# Patient Record
Sex: Male | Born: 1970
Health system: Southern US, Community
[De-identification: ages and names within clinical notes are randomized; demographics above are authoritative.]

## PROBLEM LIST (undated history)

## (undated) DIAGNOSIS — S129XXA Fracture of neck, unspecified, initial encounter: Secondary | ICD-10-CM

## (undated) DIAGNOSIS — S2239XA Fracture of one rib, unspecified side, initial encounter for closed fracture: Secondary | ICD-10-CM

## (undated) DIAGNOSIS — F419 Anxiety disorder, unspecified: Secondary | ICD-10-CM

## (undated) DIAGNOSIS — I219 Acute myocardial infarction, unspecified: Secondary | ICD-10-CM

## (undated) DIAGNOSIS — S2249XA Multiple fractures of ribs, unspecified side, initial encounter for closed fracture: Secondary | ICD-10-CM

## (undated) DIAGNOSIS — G43909 Migraine, unspecified, not intractable, without status migrainosus: Secondary | ICD-10-CM

## (undated) DIAGNOSIS — K219 Gastro-esophageal reflux disease without esophagitis: Secondary | ICD-10-CM

## (undated) DIAGNOSIS — N289 Disorder of kidney and ureter, unspecified: Secondary | ICD-10-CM

## (undated) DIAGNOSIS — J9819 Other pulmonary collapse: Secondary | ICD-10-CM

## (undated) DIAGNOSIS — M549 Dorsalgia, unspecified: Secondary | ICD-10-CM

## (undated) DIAGNOSIS — G8929 Other chronic pain: Secondary | ICD-10-CM

## (undated) HISTORY — PX: OTHER SURGICAL HISTORY: SHX169

## (undated) HISTORY — PX: KIDNEY STONE SURGERY: SHX686

---

## 2005-02-22 ENCOUNTER — Ambulatory Visit: Payer: Self-pay | Admitting: Cardiology

## 2006-02-05 ENCOUNTER — Ambulatory Visit: Payer: Self-pay | Admitting: Family Medicine

## 2006-02-16 ENCOUNTER — Ambulatory Visit: Payer: Self-pay | Admitting: Family Medicine

## 2006-02-23 ENCOUNTER — Ambulatory Visit: Payer: Self-pay | Admitting: Family Medicine

## 2006-04-09 ENCOUNTER — Ambulatory Visit (HOSPITAL_COMMUNITY): Admission: RE | Admit: 2006-04-09 | Discharge: 2006-04-09 | Payer: Self-pay | Admitting: Family Medicine

## 2006-04-16 ENCOUNTER — Ambulatory Visit (HOSPITAL_COMMUNITY): Admission: RE | Admit: 2006-04-16 | Discharge: 2006-04-16 | Payer: Self-pay | Admitting: Family Medicine

## 2006-05-07 ENCOUNTER — Ambulatory Visit (HOSPITAL_COMMUNITY): Admission: RE | Admit: 2006-05-07 | Discharge: 2006-05-07 | Payer: Self-pay | Admitting: Family Medicine

## 2006-05-21 ENCOUNTER — Ambulatory Visit (HOSPITAL_COMMUNITY): Admission: RE | Admit: 2006-05-21 | Discharge: 2006-05-21 | Payer: Self-pay | Admitting: Family Medicine

## 2007-03-12 ENCOUNTER — Encounter (INDEPENDENT_AMBULATORY_CARE_PROVIDER_SITE_OTHER): Payer: Self-pay | Admitting: Family Medicine

## 2007-05-07 ENCOUNTER — Encounter (INDEPENDENT_AMBULATORY_CARE_PROVIDER_SITE_OTHER): Payer: Self-pay | Admitting: Family Medicine

## 2007-07-29 DIAGNOSIS — F411 Generalized anxiety disorder: Secondary | ICD-10-CM | POA: Insufficient documentation

## 2007-07-29 DIAGNOSIS — F419 Anxiety disorder, unspecified: Secondary | ICD-10-CM | POA: Insufficient documentation

## 2007-07-29 DIAGNOSIS — Z87898 Personal history of other specified conditions: Secondary | ICD-10-CM | POA: Insufficient documentation

## 2007-07-29 DIAGNOSIS — J438 Other emphysema: Secondary | ICD-10-CM | POA: Insufficient documentation

## 2007-07-29 DIAGNOSIS — M545 Low back pain, unspecified: Secondary | ICD-10-CM | POA: Insufficient documentation

## 2007-07-29 DIAGNOSIS — F329 Major depressive disorder, single episode, unspecified: Secondary | ICD-10-CM | POA: Insufficient documentation

## 2007-07-29 DIAGNOSIS — Z8679 Personal history of other diseases of the circulatory system: Secondary | ICD-10-CM | POA: Insufficient documentation

## 2007-07-29 DIAGNOSIS — K219 Gastro-esophageal reflux disease without esophagitis: Secondary | ICD-10-CM | POA: Insufficient documentation

## 2009-06-18 ENCOUNTER — Emergency Department (HOSPITAL_COMMUNITY): Admission: EM | Admit: 2009-06-18 | Discharge: 2009-06-18 | Payer: Self-pay | Admitting: Family Medicine

## 2011-12-09 ENCOUNTER — Emergency Department (HOSPITAL_COMMUNITY)
Admission: EM | Admit: 2011-12-09 | Discharge: 2011-12-09 | Disposition: A | Discharge status: Home / Self Care | Payer: Self-pay | Attending: Emergency Medicine | Admitting: Emergency Medicine

## 2011-12-09 ENCOUNTER — Emergency Department (HOSPITAL_COMMUNITY): Payer: Self-pay

## 2011-12-09 ENCOUNTER — Encounter (HOSPITAL_COMMUNITY): Payer: Self-pay | Admitting: *Deleted

## 2011-12-09 DIAGNOSIS — J40 Bronchitis, not specified as acute or chronic: Secondary | ICD-10-CM

## 2011-12-09 DIAGNOSIS — F17200 Nicotine dependence, unspecified, uncomplicated: Secondary | ICD-10-CM | POA: Insufficient documentation

## 2011-12-09 HISTORY — DX: Other pulmonary collapse: J98.19

## 2011-12-09 HISTORY — DX: Fracture of one rib, unspecified side, initial encounter for closed fracture: S22.39XA

## 2011-12-09 HISTORY — DX: Multiple fractures of ribs, unspecified side, initial encounter for closed fracture: S22.49XA

## 2011-12-09 MED ORDER — ALBUTEROL SULFATE HFA 108 (90 BASE) MCG/ACT IN AERS
2.0000 | INHALATION_SPRAY | Freq: Four times a day (QID) | RESPIRATORY_TRACT | Status: DC
  Administered 2011-12-09: 2 via RESPIRATORY_TRACT
  Filled 2011-12-09: qty 6.7

## 2011-12-09 NOTE — ED Notes (Signed)
Pt c/o cold, cough, congestion, chest tightness, fever x 2 days. Pt states that his phlegm is Kharlie Bring.

## 2011-12-09 NOTE — Discharge Instructions (Signed)
Bronchitis Bronchitis is a problem of the air tubes leading to your lungs. This problem makes it hard for air to get in and out of the lungs. You may cough a lot because your air tubes are narrow. Going without care can cause lasting (chronic) bronchitis. HOME CARE   Drink enough fluids to keep your pee (urine) clear or pale yellow.   Use a cool mist humidifier.   Quit smoking if you smoke. If you keep smoking, the bronchitis might not get better.   Only take medicine as told by your doctor.  GET HELP RIGHT AWAY IF:   Coughing keeps you awake.   You start to wheeze.   You become more sick or weak.   You have a hard time breathing or get short of breath.   You cough up blood.   Coughing lasts more than 2 weeks.   You have a fever.   Your baby is older than 3 months with a rectal temperature of 102 F (38.9 C) or higher.   Your baby is 10 months old or younger with a rectal temperature of 100.4 F (38 C) or higher.  MAKE SURE YOU:  Understand these instructions.   Will watch your condition.   Will get help right away if you are not doing well or get worse.  Document Released: 01/31/2008 Document Revised: 08/03/2011 Document Reviewed: 07/16/2009 [facility] Patient Information 2012 [place], [place].  Use albuterol inhaler 2 puffs every 6 hours for the next week. Also recommend Robitussin-DM over-the-counter cough medicine for the cough. Return for any new or worse symptoms. Rest increase fluids. Chest x-ray today without evidence of pneumonia.
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
## 2013-08-19 ENCOUNTER — Emergency Department (HOSPITAL_COMMUNITY): Payer: Self-pay

## 2013-08-19 ENCOUNTER — Emergency Department (HOSPITAL_COMMUNITY)
Admission: EM | Admit: 2013-08-19 | Discharge: 2013-08-19 | Disposition: A | Discharge status: Home / Self Care | Payer: Self-pay | Attending: Emergency Medicine | Admitting: Emergency Medicine

## 2013-08-19 ENCOUNTER — Encounter (HOSPITAL_COMMUNITY): Payer: Self-pay | Admitting: Emergency Medicine

## 2013-08-19 DIAGNOSIS — Z8781 Personal history of (healed) traumatic fracture: Secondary | ICD-10-CM | POA: Insufficient documentation

## 2013-08-19 DIAGNOSIS — M549 Dorsalgia, unspecified: Secondary | ICD-10-CM | POA: Insufficient documentation

## 2013-08-19 DIAGNOSIS — F17200 Nicotine dependence, unspecified, uncomplicated: Secondary | ICD-10-CM | POA: Insufficient documentation

## 2013-08-19 DIAGNOSIS — G8929 Other chronic pain: Secondary | ICD-10-CM | POA: Insufficient documentation

## 2013-08-19 DIAGNOSIS — Z8709 Personal history of other diseases of the respiratory system: Secondary | ICD-10-CM | POA: Insufficient documentation

## 2013-08-19 DIAGNOSIS — R110 Nausea: Secondary | ICD-10-CM | POA: Insufficient documentation

## 2013-08-19 DIAGNOSIS — N200 Calculus of kidney: Secondary | ICD-10-CM | POA: Insufficient documentation

## 2013-08-19 HISTORY — DX: Fracture of neck, unspecified, initial encounter: S12.9XXA

## 2013-08-19 HISTORY — DX: Dorsalgia, unspecified: M54.9

## 2013-08-19 HISTORY — DX: Other chronic pain: G89.29

## 2013-08-19 LAB — CBC WITH DIFFERENTIAL/PLATELET
Basophils Absolute: 0 10*3/uL (ref 0.0–0.1)
Basophils Relative: 0 % (ref 0–1)
Eosinophils Absolute: 0.1 10*3/uL (ref 0.0–0.7)
Eosinophils Relative: 1 % (ref 0–5)
MCH: 29.8 pg (ref 26.0–34.0)
MCV: 91.4 fL (ref 78.0–100.0)
Neutrophils Relative %: 67 % (ref 43–77)
Platelets: 304 10*3/uL (ref 150–400)
RBC: 5.26 MIL/uL (ref 4.22–5.81)
RDW: 13 % (ref 11.5–15.5)

## 2013-08-19 LAB — COMPREHENSIVE METABOLIC PANEL WITH GFR
ALT: 21 U/L (ref 0–53)
AST: 20 U/L (ref 0–37)
Albumin: 3.7 g/dL (ref 3.5–5.2)
Alkaline Phosphatase: 62 U/L (ref 39–117)
BUN: 18 mg/dL (ref 6–23)
CO2: 27 meq/L (ref 19–32)
Calcium: 9.3 mg/dL (ref 8.4–10.5)
Chloride: 98 meq/L (ref 96–112)
Creatinine, Ser: 1.59 mg/dL — ABNORMAL HIGH (ref 0.50–1.35)
GFR calc Af Amer: 60 mL/min — ABNORMAL LOW
GFR calc non Af Amer: 52 mL/min — ABNORMAL LOW
Glucose, Bld: 104 mg/dL — ABNORMAL HIGH (ref 70–99)
Potassium: 4.2 meq/L (ref 3.5–5.1)
Sodium: 136 meq/L (ref 135–145)
Total Bilirubin: 0.8 mg/dL (ref 0.3–1.2)
Total Protein: 7.5 g/dL (ref 6.0–8.3)

## 2013-08-19 MED ORDER — OXYCODONE-ACETAMINOPHEN 5-325 MG PO TABS: 1.0000 | ORAL_TABLET | Freq: Four times a day (QID) | ORAL | Status: DC | PRN

## 2013-08-19 MED ORDER — PROMETHAZINE HCL 25 MG PO TABS: 25.0000 mg | ORAL_TABLET | Freq: Four times a day (QID) | ORAL | Status: DC | PRN

## 2013-08-19 MED ORDER — HYDROMORPHONE HCL PF 1 MG/ML IJ SOLN
1.0000 mg | Freq: Once | INTRAMUSCULAR | Status: AC
  Administered 2013-08-19: 1 mg via INTRAVENOUS
  Filled 2013-08-19: qty 1

## 2013-08-19 MED ORDER — KETOROLAC TROMETHAMINE 30 MG/ML IJ SOLN
30.0000 mg | Freq: Once | INTRAMUSCULAR | Status: AC
  Administered 2013-08-19: 30 mg via INTRAVENOUS
  Filled 2013-08-19: qty 1

## 2013-08-19 MED ORDER — ONDANSETRON HCL 4 MG/2ML IJ SOLN
4.0000 mg | Freq: Once | INTRAMUSCULAR | Status: AC
  Administered 2013-08-19: 4 mg via INTRAVENOUS
  Filled 2013-08-19: qty 2

## 2013-08-19 MED ORDER — TAMSULOSIN HCL 0.4 MG PO CAPS: 0.4000 mg | ORAL_CAPSULE | Freq: Every day | ORAL | Status: DC

## 2013-08-19 MED ORDER — SODIUM CHLORIDE 0.9 % IV BOLUS (SEPSIS)
1000.0000 mL | Freq: Once | INTRAVENOUS | Status: AC
  Administered 2013-08-19: 1000 mL via INTRAVENOUS

## 2013-08-19 MED ORDER — HYDROMORPHONE HCL PF 1 MG/ML IJ SOLN
0.5000 mg | Freq: Once | INTRAMUSCULAR | Status: AC
  Administered 2013-08-19: 0.5 mg via INTRAVENOUS
  Filled 2013-08-19: qty 1

## 2013-08-19 NOTE — ED Notes (Signed)
Pt reports having right side lower back pain for 2 days, the pain radiates to his left lower back and around to his groin area. Denies any fever, +nausea, no vomiting or diarrhea, last bm yesterday.  H/o of kidney stones and chronic back pain.
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
## 2013-08-19 NOTE — ED Notes (Signed)
States his pain is starting to come back .  Rates pain a 4/10

## 2013-08-25 ENCOUNTER — Emergency Department (HOSPITAL_COMMUNITY): Payer: Self-pay

## 2013-08-25 ENCOUNTER — Emergency Department (HOSPITAL_COMMUNITY)
Admission: EM | Admit: 2013-08-25 | Discharge: 2013-08-25 | Disposition: A | Discharge status: Home / Self Care | Payer: Self-pay | Attending: Emergency Medicine | Admitting: Emergency Medicine

## 2013-08-25 ENCOUNTER — Encounter (HOSPITAL_COMMUNITY): Payer: Self-pay | Admitting: Emergency Medicine

## 2013-08-25 DIAGNOSIS — M79609 Pain in unspecified limb: Secondary | ICD-10-CM | POA: Insufficient documentation

## 2013-08-25 DIAGNOSIS — N23 Unspecified renal colic: Secondary | ICD-10-CM

## 2013-08-25 DIAGNOSIS — Z79899 Other long term (current) drug therapy: Secondary | ICD-10-CM | POA: Insufficient documentation

## 2013-08-25 DIAGNOSIS — N200 Calculus of kidney: Secondary | ICD-10-CM | POA: Insufficient documentation

## 2013-08-25 DIAGNOSIS — G8929 Other chronic pain: Secondary | ICD-10-CM | POA: Insufficient documentation

## 2013-08-25 DIAGNOSIS — Z87828 Personal history of other (healed) physical injury and trauma: Secondary | ICD-10-CM | POA: Insufficient documentation

## 2013-08-25 DIAGNOSIS — R110 Nausea: Secondary | ICD-10-CM | POA: Insufficient documentation

## 2013-08-25 DIAGNOSIS — Z8709 Personal history of other diseases of the respiratory system: Secondary | ICD-10-CM | POA: Insufficient documentation

## 2013-08-25 DIAGNOSIS — Z791 Long term (current) use of non-steroidal anti-inflammatories (NSAID): Secondary | ICD-10-CM | POA: Insufficient documentation

## 2013-08-25 DIAGNOSIS — R1110 Vomiting, unspecified: Secondary | ICD-10-CM | POA: Insufficient documentation

## 2013-08-25 DIAGNOSIS — Z8781 Personal history of (healed) traumatic fracture: Secondary | ICD-10-CM | POA: Insufficient documentation

## 2013-08-25 DIAGNOSIS — F17200 Nicotine dependence, unspecified, uncomplicated: Secondary | ICD-10-CM | POA: Insufficient documentation

## 2013-08-25 LAB — URINALYSIS, ROUTINE W REFLEX MICROSCOPIC
Bilirubin Urine: NEGATIVE
Glucose, UA: NEGATIVE mg/dL
Nitrite: NEGATIVE
Specific Gravity, Urine: >1.03 — ABNORMAL HIGH (ref 1.005–1.030)
pH: 5.5 (ref 5.0–8.0)

## 2013-08-25 LAB — POCT I-STAT, CHEM 8
BUN: 15 mg/dL (ref 6–23)
Chloride: 106 mEq/L (ref 96–112)
Glucose, Bld: 86 mg/dL (ref 70–99)
HCT: 43 % (ref 39.0–52.0)
Potassium: 4 mEq/L (ref 3.5–5.1)

## 2013-08-25 LAB — URINE MICROSCOPIC-ADD ON

## 2013-08-25 MED ORDER — HYDROMORPHONE HCL PF 1 MG/ML IJ SOLN
1.0000 mg | Freq: Once | INTRAMUSCULAR | Status: AC
  Administered 2013-08-25: 1 mg via INTRAVENOUS
  Filled 2013-08-25: qty 1

## 2013-08-25 MED ORDER — CIPROFLOXACIN HCL 500 MG PO TABS: 500.0000 mg | ORAL_TABLET | Freq: Two times a day (BID) | ORAL | Status: DC

## 2013-08-25 MED ORDER — SODIUM CHLORIDE 0.9 % IV BOLUS (SEPSIS)
1000.0000 mL | Freq: Once | INTRAVENOUS | Status: AC
  Administered 2013-08-25: 1000 mL via INTRAVENOUS

## 2013-08-25 MED ORDER — ONDANSETRON HCL 4 MG/2ML IJ SOLN
4.0000 mg | Freq: Once | INTRAMUSCULAR | Status: AC
  Administered 2013-08-25: 4 mg via INTRAVENOUS
  Filled 2013-08-25: qty 2

## 2013-08-25 MED ORDER — OXYCODONE-ACETAMINOPHEN 10-325 MG PO TABS: 1.0000 | ORAL_TABLET | ORAL | Status: DC | PRN

## 2013-08-25 MED ORDER — CIPROFLOXACIN HCL 250 MG PO TABS
500.0000 mg | ORAL_TABLET | Freq: Once | ORAL | Status: AC
  Administered 2013-08-25: 500 mg via ORAL
  Filled 2013-08-25: qty 2
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
## 2013-08-25 NOTE — Discharge Instructions (Signed)
Kidney Stones  Kidney stones (urolithiasis) are deposits that form inside your kidneys. The intense pain is caused by the stone moving through the urinary tract. When the stone moves, the ureter goes into spasm around the stone. The stone is usually passed in the urine.   CAUSES   · A disorder that makes certain neck glands produce too much parathyroid hormone (primary hyperparathyroidism).  · A buildup of uric acid crystals, similar to gout in your joints.  · Narrowing (stricture) of the ureter.  · A kidney obstruction present at birth (congenital obstruction).  · Previous surgery on the kidney or ureters.  · Numerous kidney infections.  SYMPTOMS   · Feeling sick to your stomach (nauseous).  · Throwing up (vomiting).  · Blood in the urine (hematuria).  · Pain that usually spreads (radiates) to the groin.  · Frequency or urgency of urination.  DIAGNOSIS   · Taking a history and physical exam.  · Blood or urine tests.  · CT scan.  · Occasionally, an examination of the inside of the urinary bladder (cystoscopy) is performed.  TREATMENT   · Observation.  · Increasing your fluid intake.  · Extracorporeal shock wave lithotripsy This is a noninvasive procedure that uses shock waves to break up kidney stones.  · Surgery may be needed if you have severe pain or persistent obstruction. There are various surgical procedures. Most of the procedures are performed with the use of small instruments. Only small incisions are needed to accommodate these instruments, so recovery time is minimized.  The size, location, and chemical composition are all important variables that will determine the proper choice of action for you. Talk to your health care provider to better understand your situation so that you will minimize the risk of injury to yourself and your kidney.   HOME CARE INSTRUCTIONS   · Drink enough water and fluids to keep your urine clear or pale yellow. This will help you to pass the stone or stone fragments.  · Strain  all urine through the provided strainer. Keep all particulate matter and stones for your health care provider to see. The stone causing the pain may be as small as a grain of salt. It is very important to use the strainer each and every time you pass your urine. The collection of your stone will allow your health care provider to analyze it and verify that a stone has actually passed. The stone analysis will often identify what you can do to reduce the incidence of recurrences.  · Only take over-the-counter or prescription medicines for pain, discomfort, or fever as directed by your health care provider.  · Make a follow-up appointment with your health care provider as directed.  · Get follow-up X-rays if required. The absence of pain does not always mean that the stone has passed. It may have only stopped moving. If the urine remains completely obstructed, it can cause loss of kidney function or even complete destruction of the kidney. It is your responsibility to make sure X-rays and follow-ups are completed. Ultrasounds of the kidney can show blockages and the status of the kidney. Ultrasounds are not associated with any radiation and can be performed easily in a matter of minutes.  SEEK MEDICAL CARE IF:  · You experience pain that is progressive and unresponsive to any pain medicine you have been prescribed.  SEEK IMMEDIATE MEDICAL CARE IF:   · Pain cannot be controlled with the prescribed medicine.  · You have a fever   or shaking chills.  · The severity or intensity of pain increases over 18 hours and is not relieved by pain medicine.  · You develop a new onset of abdominal pain.  · You feel faint or pass out.  · You are unable to urinate.  MAKE SURE YOU:   · Understand these instructions.  · Will watch your condition.  · Will get help right away if you are not doing well or get worse.  Document Released: 08/14/2005 Document Revised: 04/16/2013 Document Reviewed: 01/15/2013  [employer]® Patient Information ©2014  [employer].

## 2013-08-25 NOTE — ED Notes (Signed)
Pain lt flank ,  Seen here 12/23, and dx with kidney stones.

## 2013-08-26 LAB — URINE CULTURE: Culture: NO GROWTH

## 2013-09-03 ENCOUNTER — Emergency Department (HOSPITAL_COMMUNITY): Payer: Self-pay

## 2013-09-03 ENCOUNTER — Encounter (HOSPITAL_COMMUNITY): Payer: Self-pay | Admitting: Pharmacy Technician

## 2013-09-03 ENCOUNTER — Encounter (HOSPITAL_COMMUNITY): Payer: Self-pay | Admitting: Emergency Medicine

## 2013-09-03 ENCOUNTER — Observation Stay (HOSPITAL_COMMUNITY)
Admission: EM | Admit: 2013-09-03 | Discharge: 2013-09-03 | Disposition: A | Discharge status: Home / Self Care | Payer: Self-pay | Attending: Emergency Medicine | Admitting: Emergency Medicine

## 2013-09-03 DIAGNOSIS — R339 Retention of urine, unspecified: Secondary | ICD-10-CM | POA: Insufficient documentation

## 2013-09-03 DIAGNOSIS — N1330 Unspecified hydronephrosis: Secondary | ICD-10-CM | POA: Diagnosis present

## 2013-09-03 DIAGNOSIS — N200 Calculus of kidney: Principal | ICD-10-CM

## 2013-09-03 DIAGNOSIS — R109 Unspecified abdominal pain: Secondary | ICD-10-CM | POA: Insufficient documentation

## 2013-09-03 DIAGNOSIS — F419 Anxiety disorder, unspecified: Secondary | ICD-10-CM | POA: Diagnosis present

## 2013-09-03 DIAGNOSIS — K219 Gastro-esophageal reflux disease without esophagitis: Secondary | ICD-10-CM | POA: Diagnosis present

## 2013-09-03 DIAGNOSIS — J438 Other emphysema: Secondary | ICD-10-CM | POA: Diagnosis present

## 2013-09-03 DIAGNOSIS — F411 Generalized anxiety disorder: Secondary | ICD-10-CM | POA: Diagnosis present

## 2013-09-03 HISTORY — DX: Disorder of kidney and ureter, unspecified: N28.9

## 2013-09-03 LAB — URINALYSIS, ROUTINE W REFLEX MICROSCOPIC
Bilirubin Urine: NEGATIVE
Glucose, UA: NEGATIVE mg/dL
Hgb urine dipstick: NEGATIVE
KETONES UR: NEGATIVE mg/dL
Leukocytes, UA: NEGATIVE
Nitrite: NEGATIVE
PH: 5.5 (ref 5.0–8.0)
Protein, ur: NEGATIVE mg/dL
Urobilinogen, UA: 0.2 mg/dL (ref 0.0–1.0)

## 2013-09-03 LAB — CBC WITH DIFFERENTIAL/PLATELET
BASOS ABS: 0.1 10*3/uL (ref 0.0–0.1)
BASOS PCT: 1 % (ref 0–1)
Eosinophils Absolute: 0.2 10*3/uL (ref 0.0–0.7)
Eosinophils Relative: 2 % (ref 0–5)
HCT: 45.8 % (ref 39.0–52.0)
Hemoglobin: 15.2 g/dL (ref 13.0–17.0)
Lymphocytes Relative: 39 % (ref 12–46)
Lymphs Abs: 3.8 10*3/uL (ref 0.7–4.0)
MCH: 30.7 pg (ref 26.0–34.0)
MCHC: 33.2 g/dL (ref 30.0–36.0)
MCV: 92.5 fL (ref 78.0–100.0)
MONOS PCT: 7 % (ref 3–12)
Monocytes Absolute: 0.7 10*3/uL (ref 0.1–1.0)
NEUTROS ABS: 5 10*3/uL (ref 1.7–7.7)
NEUTROS PCT: 51 % (ref 43–77)
PLATELETS: 347 10*3/uL (ref 150–400)
RBC: 4.95 MIL/uL (ref 4.22–5.81)
RDW: 13.2 % (ref 11.5–15.5)
WBC: 9.8 10*3/uL (ref 4.0–10.5)

## 2013-09-03 LAB — BASIC METABOLIC PANEL
BUN: 15 mg/dL (ref 6–23)
CHLORIDE: 107 meq/L (ref 96–112)
CO2: 26 mEq/L (ref 19–32)
Calcium: 9.3 mg/dL (ref 8.4–10.5)
Creatinine, Ser: 0.96 mg/dL (ref 0.50–1.35)
GFR calc Af Amer: >90 mL/min (ref >90)
GFR calc non Af Amer: >90 mL/min (ref >90)
Glucose, Bld: 92 mg/dL (ref 70–99)
POTASSIUM: 4 meq/L (ref 3.7–5.3)
SODIUM: 143 meq/L (ref 137–147)

## 2013-09-03 MED ORDER — ONDANSETRON HCL 4 MG/2ML IJ SOLN: 4.0000 mg | Freq: Three times a day (TID) | INTRAMUSCULAR | Status: DC | PRN

## 2013-09-03 MED ORDER — HYDROMORPHONE HCL PF 1 MG/ML IJ SOLN
1.0000 mg | Freq: Once | INTRAMUSCULAR | Status: AC
  Administered 2013-09-03: 1 mg via INTRAVENOUS

## 2013-09-03 MED ORDER — HYDROMORPHONE HCL PF 1 MG/ML IJ SOLN
1.0000 mg | Freq: Once | INTRAMUSCULAR | Status: AC
  Administered 2013-09-03: 1 mg via INTRAVENOUS
  Filled 2013-09-03: qty 1

## 2013-09-03 MED ORDER — PROMETHAZINE HCL 25 MG PO TABS: 25.0000 mg | ORAL_TABLET | Freq: Four times a day (QID) | ORAL | Status: DC | PRN

## 2013-09-03 MED ORDER — SODIUM CHLORIDE 0.9 % IV SOLN
INTRAVENOUS | Status: DC
  Administered 2013-09-03: 14:00:00 via INTRAVENOUS

## 2013-09-03 MED ORDER — ONDANSETRON HCL 4 MG/2ML IJ SOLN
4.0000 mg | Freq: Once | INTRAMUSCULAR | Status: AC
  Administered 2013-09-03: 4 mg via INTRAVENOUS
  Filled 2013-09-03: qty 2

## 2013-09-03 MED ORDER — SODIUM CHLORIDE 0.9 % IV SOLN
Freq: Once | INTRAVENOUS | Status: AC
  Administered 2013-09-03: 11:00:00 via INTRAVENOUS

## 2013-09-03 MED ORDER — HYDROMORPHONE HCL PF 1 MG/ML IJ SOLN: 1.0000 mg | INTRAMUSCULAR | Status: DC | PRN

## 2013-09-03 MED ORDER — HYDROMORPHONE HCL PF 1 MG/ML IJ SOLN
INTRAMUSCULAR | Status: AC
  Filled 2013-09-03: qty 1

## 2013-09-03 MED ORDER — OXYCODONE-ACETAMINOPHEN 10-325 MG PO TABS: 1.0000 | ORAL_TABLET | ORAL | Status: DC | PRN

## 2013-09-03 NOTE — ED Notes (Signed)
Called and spoke with Eric [name] in Day [facility]. Procedure tomorrow at 1:40p. Pt needs to arrive at Day Surgery at 12 noon. Npo after midnight. Hold Motrin and Phenegran. May take other meds with sip of water in am. [name] from OR called and confirmed as well. Well write instructions on pt dc paperwork for home.

## 2013-09-03 NOTE — ED Notes (Signed)
Pt called out and states, " I need something else for my pain. It is still hurting I can't wait until its time again." Dr. Estell HarpinZammit aware and 1 now dose of Dilaudid obtained.

## 2013-09-03 NOTE — Discharge Instructions (Signed)
Follow up with Dr. Jerre SimonJavaid tomorrow as planned
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
## 2013-09-03 NOTE — ED Notes (Signed)
New order for pain medication

## 2013-09-03 NOTE — ED Notes (Signed)
Dr. Frann RiderJavid in and discussed with pt treatment and plan. Dr. Frann RiderJavid feels like pt can go home today with pain prescription and return tomorrow to have procedure to remove stones as out patient. Pt agreed and Dr. Estell HarpinZammit went in room to confirm with pt as well.

## 2013-09-03 NOTE — ED Notes (Signed)
Pt voided in urinal

## 2013-09-03 NOTE — ED Notes (Signed)
Requesting more pain medication. Will inform Dr. Estell HarpinZammit

## 2013-09-03 NOTE — ED Notes (Signed)
Pt reports left flank pain off/on for 2 1/2 weeks, has been to ed ad referred to urology, but they will not see him, denies any bloo din his urine, +nausea, urinary retention. Is out of meds for pain x 2 days. Unsure of name.

## 2013-09-04 ENCOUNTER — Ambulatory Visit (HOSPITAL_COMMUNITY): Payer: Self-pay | Admitting: Anesthesiology

## 2013-09-04 ENCOUNTER — Ambulatory Visit (HOSPITAL_COMMUNITY): Payer: Self-pay

## 2013-09-04 ENCOUNTER — Ambulatory Visit (HOSPITAL_COMMUNITY)
Admission: RE | Admit: 2013-09-04 | Discharge: 2013-09-04 | Disposition: A | Discharge status: Home / Self Care | Payer: Self-pay | Source: Ambulatory Visit | Attending: Urology | Admitting: Urology

## 2013-09-04 ENCOUNTER — Encounter (HOSPITAL_COMMUNITY)
Admission: RE | Disposition: A | Discharge status: Home / Self Care | Payer: Self-pay | Source: Ambulatory Visit | Attending: Urology

## 2013-09-04 ENCOUNTER — Encounter (HOSPITAL_COMMUNITY): Payer: Self-pay | Admitting: Anesthesiology

## 2013-09-04 ENCOUNTER — Encounter (HOSPITAL_COMMUNITY): Payer: Self-pay | Admitting: *Deleted

## 2013-09-04 DIAGNOSIS — N201 Calculus of ureter: Secondary | ICD-10-CM | POA: Insufficient documentation

## 2013-09-04 HISTORY — DX: Gastro-esophageal reflux disease without esophagitis: K21.9

## 2013-09-04 HISTORY — PX: CYSTOSCOPY W/ URETERAL STENT PLACEMENT: SHX1429

## 2013-09-04 HISTORY — PX: HOLMIUM LASER APPLICATION: SHX5852

## 2013-09-04 HISTORY — DX: Migraine, unspecified, not intractable, without status migrainosus: G43.909

## 2013-09-04 HISTORY — PX: STONE EXTRACTION WITH BASKET: SHX5318

## 2013-09-04 SURGERY — CYSTOSCOPY, WITH RETROGRADE PYELOGRAM AND URETERAL STENT INSERTION
Anesthesia: General | Laterality: Left

## 2013-09-04 MED ORDER — ROCURONIUM BROMIDE 50 MG/5ML IV SOLN
INTRAVENOUS | Status: AC
  Filled 2013-09-04: qty 1

## 2013-09-04 MED ORDER — FENTANYL CITRATE 0.05 MG/ML IJ SOLN
INTRAMUSCULAR | Status: AC
  Filled 2013-09-04: qty 2

## 2013-09-04 MED ORDER — PROPOFOL 10 MG/ML IV EMUL
INTRAVENOUS | Status: AC
  Filled 2013-09-04: qty 20

## 2013-09-04 MED ORDER — FENTANYL CITRATE 0.05 MG/ML IJ SOLN
25.0000 ug | INTRAMUSCULAR | Status: AC
  Administered 2013-09-04 (×2): 25 ug via INTRAVENOUS

## 2013-09-04 MED ORDER — OXYCODONE-ACETAMINOPHEN 7.5-325 MG PO TABS: 1.0000 | ORAL_TABLET | Freq: Four times a day (QID) | ORAL | Status: DC | PRN

## 2013-09-04 MED ORDER — LIDOCAINE HCL (PF) 1 % IJ SOLN
INTRAMUSCULAR | Status: AC
  Filled 2013-09-04: qty 5

## 2013-09-04 MED ORDER — ONDANSETRON HCL 4 MG/2ML IJ SOLN
4.0000 mg | Freq: Once | INTRAMUSCULAR | Status: AC
Start: 2013-09-04 — End: 2013-09-04
  Administered 2013-09-04: 4 mg via INTRAVENOUS

## 2013-09-04 MED ORDER — FENTANYL CITRATE 0.05 MG/ML IJ SOLN
INTRAMUSCULAR | Status: DC | PRN
  Administered 2013-09-04 (×2): 25 ug via INTRAVENOUS
  Administered 2013-09-04: 12.5 ug via INTRAVENOUS
  Administered 2013-09-04 (×2): 50 ug via INTRAVENOUS
  Administered 2013-09-04: 12.5 ug via INTRAVENOUS
  Administered 2013-09-04: 25 ug via INTRAVENOUS

## 2013-09-04 MED ORDER — SODIUM CHLORIDE 0.9 % IR SOLN
Status: DC | PRN
  Administered 2013-09-04 (×5): 3000 mL

## 2013-09-04 MED ORDER — PROPOFOL 10 MG/ML IV BOLUS
INTRAVENOUS | Status: DC | PRN
Start: 2013-09-04 — End: 2013-09-04
  Administered 2013-09-04: 10 mg via INTRAVENOUS
  Administered 2013-09-04: 180 mg via INTRAVENOUS
  Administered 2013-09-04 (×5): 10 mg via INTRAVENOUS
  Administered 2013-09-04: 20 mg via INTRAVENOUS
  Administered 2013-09-04: 30 mg via INTRAVENOUS
  Administered 2013-09-04: 10 mg via INTRAVENOUS

## 2013-09-04 MED ORDER — LACTATED RINGERS IV SOLN
INTRAVENOUS | Status: DC
  Administered 2013-09-04 (×2): via INTRAVENOUS

## 2013-09-04 MED ORDER — IOHEXOL 350 MG/ML SOLN
INTRAVENOUS | Status: DC | PRN
  Administered 2013-09-04: 50 mL

## 2013-09-04 MED ORDER — LIDOCAINE HCL (CARDIAC) 10 MG/ML IV SOLN
INTRAVENOUS | Status: DC | PRN
Start: 2013-09-04 — End: 2013-09-04
  Administered 2013-09-04: 20 mg via INTRAVENOUS

## 2013-09-04 MED ORDER — ONDANSETRON HCL 4 MG/2ML IJ SOLN: 4.0000 mg | Freq: Once | INTRAMUSCULAR | Status: DC | PRN

## 2013-09-04 MED ORDER — MIDAZOLAM HCL 2 MG/2ML IJ SOLN
INTRAMUSCULAR | Status: AC
Start: 2013-09-04 — End: 2013-09-04
  Filled 2013-09-04: qty 2

## 2013-09-04 MED ORDER — FENTANYL CITRATE 0.05 MG/ML IJ SOLN
25.0000 ug | INTRAMUSCULAR | Status: DC | PRN
  Administered 2013-09-04 (×4): 50 ug via INTRAVENOUS

## 2013-09-04 MED ORDER — MIDAZOLAM HCL 2 MG/2ML IJ SOLN
1.0000 mg | INTRAMUSCULAR | Status: DC | PRN
  Administered 2013-09-04: 2 mg via INTRAVENOUS

## 2013-09-04 MED ORDER — STERILE WATER FOR IRRIGATION IR SOLN
Status: DC | PRN
  Administered 2013-09-04: 1000 mL

## 2013-09-04 MED ORDER — ONDANSETRON HCL 4 MG/2ML IJ SOLN
INTRAMUSCULAR | Status: AC
  Filled 2013-09-04: qty 2

## 2013-09-04 SURGICAL SUPPLY — 29 items
BAG DRAIN URO TABLE W/ADPT NS (DRAPE) ×2 IMPLANT
BAG DRN 8 ADPR NS SKTRN CSTL (DRAPE) ×1
CATH 5 FR WEDGE TIP (UROLOGICAL SUPPLIES) ×2 IMPLANT
CATH OPEN TIP 5FR (CATHETERS) ×2 IMPLANT
CLOTH BEACON ORANGE TIMEOUT ST (SAFETY) ×2 IMPLANT
DECANTER SPIKE VIAL GLASS SM (MISCELLANEOUS) ×2 IMPLANT
DILATOR UROMAX ULTRA (MISCELLANEOUS) IMPLANT
GLOVE BIO SURGEON STRL SZ7 (GLOVE) ×2 IMPLANT
GLOVE BIOGEL PI IND STRL 6.5 (GLOVE) IMPLANT
GLOVE BIOGEL PI IND STRL 7.0 (GLOVE) IMPLANT
GLOVE BIOGEL PI INDICATOR 6.5 (GLOVE) ×1
GLOVE BIOGEL PI INDICATOR 7.0 (GLOVE) ×1
GLOVE EXAM NITRILE MD LF STRL (GLOVE) ×1 IMPLANT
GLOVE SS BIOGEL STRL SZ 6.5 (GLOVE) IMPLANT
GLOVE SUPERSENSE BIOGEL SZ 6.5 (GLOVE) ×1
GOWN STRL REIN XL XLG (GOWN DISPOSABLE) ×2 IMPLANT
IV NS IRRIG 3000ML ARTHROMATIC (IV SOLUTION) ×7 IMPLANT
KIT ROOM TURNOVER AP CYSTO (KITS) ×2 IMPLANT
LASER FIBER DISP (UROLOGICAL SUPPLIES) ×1 IMPLANT
LASER FIBER DISP 1000U (UROLOGICAL SUPPLIES) IMPLANT
MANIFOLD NEPTUNE II (INSTRUMENTS) ×2 IMPLANT
PACK CYSTO (CUSTOM PROCEDURE TRAY) ×2 IMPLANT
PAD ARMBOARD 7.5X6 YLW CONV (MISCELLANEOUS) ×2 IMPLANT
SET IRRIGATING DISP (SET/KITS/TRAYS/PACK) ×2 IMPLANT
STENT PERCUFLEX 4.8FRX24 (STENTS) ×1 IMPLANT
STONE RETRIEVAL GEMINI 2.4 FR (MISCELLANEOUS) ×2 IMPLANT
TAPE CLOTH 4X10 WHT NS (GAUZE/BANDAGES/DRESSINGS) ×1 IMPLANT
TOWEL OR 17X26 4PK STRL BLUE (TOWEL DISPOSABLE) ×2 IMPLANT
WIRE GUIDE BENTSON .035 15CM (WIRE) ×3 IMPLANT

## 2013-09-04 NOTE — Progress Notes (Signed)
No change in H&P on repeat examination. 
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
## 2013-09-04 NOTE — Brief Op Note (Signed)
09/04/2013  4:09 PM  PATIENT:  Sinda Duhristopher M Karas  43 y.o. male  PRE-OPERATIVE DIAGNOSIS:  Left Ureteral Stone    POST-OPERATIVE DIAGNOSIS:  Left Ureteral Stone    PROCEDURE:  Procedure(s): CYSTOSCOPY WITH RETROGRADE PYELOGRAM/URETERAL STENT PLACEMENT (Left) STONE EXTRACTION WITH BASKET (Left) HOLMIUM LASER APPLICATION (Left)  SURGEON:  Surgeon(s) and Role:    * Ky BarbanMohammad I Rossie Scarfone, MD - Primary  PHYSICIAN ASSISTANT:   ASSISTANTS: none   ANESTHESIA:   general  EBL:  Total I/O In: 1500 [I.V.:1500] Out: 0   BLOOD ADMINISTERED:none  DRAINS: double j in l ureter with string   LOCAL MEDICATIONS USED:  NONE  SPECIMEN:  Source of Specimen:  stones given to the family to bring in office for analysis  DISPOSITION OF SPECIMEN:  N/A  COUNTS:  YES  TOURNIQUET:  * No tourniquets in log *  DICTATION: .Other Dictation: Dictation Number dictation [id number]  PLAN OF CARE: Discharge to home after PACU  PATIENT DISPOSITION:  PACU - hemodynamically stable.   Delay start of Pharmacological VTE agent (>24hrs) due to surgical blood loss or risk of bleeding:

## 2013-09-04 NOTE — Transfer of Care (Signed)
Immediate Anesthesia Transfer of Care Note  Patient: Eric [name]  Procedure(s) Performed: Procedure(s) (LRB): CYSTOSCOPY WITH RETROGRADE PYELOGRAM/URETERAL STENT PLACEMENT (Left) STONE EXTRACTION WITH BASKET (Left) HOLMIUM LASER APPLICATION (Left)  Patient Location: PACU  Anesthesia Type: General  Level of Consciousness: awake  Airway & Oxygen Therapy: Patient Spontanous Breathing and non-rebreather face mask  Post-op Assessment: Report given to PACU RN, Post -op Vital signs reviewed and stable and Patient moving all extremities  Post vital signs: Reviewed and stable  Complications: No apparent anesthesia complications

## 2013-09-04 NOTE — Anesthesia Postprocedure Evaluation (Signed)
Anesthesia Post Note  Patient: Eric [name]  Procedure(s) Performed: Procedure(s) (LRB): CYSTOSCOPY WITH RETROGRADE PYELOGRAM/URETERAL STENT PLACEMENT (Left) STONE EXTRACTION WITH BASKET (Left) HOLMIUM LASER APPLICATION (Left)  Anesthesia type: General  Patient location: PACU  Post pain: Pain level controlled  Post assessment: Post-op Vital signs reviewed, Patient's Cardiovascular Status Stable, Respiratory Function Stable, Patent Airway, No signs of Nausea or vomiting and Pain level controlled  Last Vitals:  Filed Vitals:   09/04/13 1619  BP: 132/62  Pulse: 107  Temp: 36.4 C  Resp: 16    Post vital signs: Reviewed and stable  Level of consciousness: awake and alert   Complications: No apparent anesthesia complications

## 2013-09-04 NOTE — Discharge Instructions (Signed)
Come to er on sat at 10 am for string stent removal call if pain or fever >101F  Cystoscopy, Care After Refer to this sheet in the next few weeks. These instructions provide you with information on caring for yourself after your procedure. Your caregiver may also give you more specific instructions. Your treatment has been planned according to current medical practices, but problems sometimes occur. Call your caregiver if you have any problems or questions after your procedure. HOME CARE INSTRUCTIONS  Things you can do to ease any discomfort after your procedure include:  Drinking enough water and fluids to keep your urine clear or pale yellow.  Taking a warm bath to relieve any burning feelings. SEEK IMMEDIATE MEDICAL CARE IF:   You have an increase in blood in your urine.  You notice blood clots in your urine.  You have difficulty passing urine.  You have the chills.  You have abdominal pain.  You have a fever or persistent symptoms for more than 2 3 days.  You have a fever and your symptoms suddenly get worse. MAKE SURE YOU:   Understand these instructions.  Will watch your condition.  Will get help right away if you are not doing well or get worse. Document Released: 03/03/2005 Document Revised: 04/16/2013 Document Reviewed: 02/05/2012 [facility] Patient Information 2014 [place], [place].

## 2013-09-04 NOTE — Anesthesia Procedure Notes (Signed)
Procedure Name: LMA Insertion Date/Time: 09/04/2013 2:21 PM Performed by: Franco NonesYATES, Kimsey Demaree S Pre-anesthesia Checklist: Patient identified, Patient being monitored, Emergency Drugs available, Timeout performed and Suction available Patient Re-evaluated:Patient Re-evaluated prior to inductionOxygen Delivery Method: Circle System Utilized Preoxygenation: Pre-oxygenation with 100% oxygen Intubation Type: IV induction Ventilation: Mask ventilation without difficulty LMA: LMA inserted LMA Size: 4.0 Number of attempts: 1 Placement Confirmation: positive ETCO2 and breath sounds checked- equal and bilateral Tube secured with: Tape

## 2013-09-05 ENCOUNTER — Emergency Department (HOSPITAL_COMMUNITY)
Admission: EM | Admit: 2013-09-05 | Discharge: 2013-09-05 | Discharge status: Against Medical Advice | Payer: Self-pay | Attending: Emergency Medicine | Admitting: Emergency Medicine

## 2013-09-05 ENCOUNTER — Encounter (HOSPITAL_COMMUNITY): Payer: Self-pay | Admitting: Emergency Medicine

## 2013-09-05 DIAGNOSIS — F17200 Nicotine dependence, unspecified, uncomplicated: Secondary | ICD-10-CM | POA: Insufficient documentation

## 2013-09-05 DIAGNOSIS — R109 Unspecified abdominal pain: Secondary | ICD-10-CM | POA: Insufficient documentation

## 2013-09-05 MED ORDER — ONDANSETRON HCL 4 MG/2ML IJ SOLN
4.0000 mg | Freq: Once | INTRAMUSCULAR | Status: DC
  Filled 2013-09-05: qty 2

## 2013-09-05 MED ORDER — ONDANSETRON HCL 4 MG/2ML IJ SOLN
4.0000 mg | Freq: Once | INTRAMUSCULAR | Status: DC
Start: 2013-09-05 — End: 2013-09-05

## 2013-09-05 MED ORDER — HYDROMORPHONE HCL PF 1 MG/ML IJ SOLN
1.0000 mg | Freq: Once | INTRAMUSCULAR | Status: AC
  Administered 2013-09-05: 1 mg via INTRAMUSCULAR

## 2013-09-05 MED ORDER — HYDROMORPHONE HCL PF 1 MG/ML IJ SOLN
1.0000 mg | Freq: Once | INTRAMUSCULAR | Status: DC
  Filled 2013-09-05: qty 1

## 2013-09-05 MED ORDER — ONDANSETRON 8 MG PO TBDP
8.0000 mg | ORAL_TABLET | Freq: Once | ORAL | Status: AC
  Administered 2013-09-05: 8 mg via ORAL
  Filled 2013-09-05: qty 1

## 2013-09-05 NOTE — ED Notes (Signed)
Dr Javaid at bedside. 

## 2013-09-05 NOTE — ED Notes (Signed)
Groin pain after having removal of kidney stone yesterday,To meet Dr Jerre SimonJavaid.

## 2013-09-05 NOTE — Op Note (Signed)
NAME:  Dewan, Gurnoor                ACCOUNT NO.:  MEDICAL RECORD NO.:  [id number]  LOCATION:                                 FACILITY:  PHYSICIAN:  Ky BarbanMohammad I. Brogen Duell, M.D.DATE OF BIRTH:  09/27/1970  DATE OF PROCEDURE: DATE OF DISCHARGE:                              OPERATIVE REPORT   PREOPERATIVE DIAGNOSIS:  Left ureteral calculi.  POSTOPERATIVE DIAGNOSIS: Left ureteral calculi.  PROCEDURE: 1. Cystoscopy. 2. Ureteroscopic stone basket extraction of left ureteral calculi, 2     of them.  ANESTHESIA:  General.  DESCRIPTION OF PROCEDURE:  The patient under general endotracheal anesthesia in lithotomy position.  After usual prep and drape, #25 cystoscope was introduced into the bladder.  Bladder was inspected.  The right ureteral orifice was found to be swollen and I think that is where it was because of the stone and I can see the stone sticking out from the ureteral orifice.  It was grabbed with the help of foreign body forceps, tried to remove it, but the stone broke off.  So, I decided to put a guidewire.  Then, after considerable difficulty, I was able to get the guidewire in and the stone was then broken further with the holmium laser.  Energy setting was at 0.6 joules, rate 5 pulse and used power of 30 watts, total energy used was 0.10 kilos joules.  The stone was broken, and then, I was able to engage in a basket to get the stone out. Then, the guidewire was passed up into the renal pelvis and I put open- ended catheter over the guidewire to confirm the position of the guidewire and the dye was injected through the open-ended catheter that outlines the collecting system.  The guidewire was reintroduced and over the guidewire, I was able to introduce short rigid ureteroscope to get the other stone out.  It was in the distal left ureter engaged in the basket, removed the stone.  Now, I decided to leave a double-J stent.  A 5-French 24-cm double-J stent was  introduced through the cystoscope and advanced up into the renal pelvis.  After removing the guidewire, a nice loop was obtained in the renal pelvis and the bladder.  I left the string attached.  All the instruments were removed.  The patient left the operating room in satisfactory condition.    Ky BarbanMohammad I. Kenneisha Cochrane, M.D.    MIJ/MEDQ  D:  09/04/2013  T:  09/05/2013  Job:  [id number]

## 2013-09-05 NOTE — Consult Note (Signed)
Had stone basket yesterday by me no complications co pain lower abdomen an d genitalia no fever wants stronger pain medication.will give 1mg  iv dilaudid he is on 7.5 mg percocet at home not helping will give 5mg  percocet so if he wants can take 2 tablets q4h prn. iwill see in office Monday to remove string stent. Abdomen soft non distended no rebound tenderness.itold if symptoms change let me know.

## 2013-09-05 NOTE — ED Notes (Addendum)
Per Dr. Jerre SimonJavaid, Dr Jerre SimonJavaid gave a handwritten Rx for Percocet 10/325mg , One pill q4hrs PRN pain # 30.

## 2013-09-05 NOTE — Discharge Instructions (Signed)
Follow up with Dr. Jerre SimonJavaid in his office on Monday, 1//12/15 at 4pm.  Stop taking Percocet 7.5mg  perscription and start new prescription,.  Do NOT drive or operate machinery while taking percocet.  Return to ED for worsening symptoms.

## 2013-09-08 ENCOUNTER — Encounter (HOSPITAL_COMMUNITY): Payer: Self-pay | Admitting: Urology

## 2013-09-09 NOTE — H&P (Signed)
NAME:  Eric [name], Eric [name]           ACCOUNT NO.:  [id number]  MEDICAL RECORD NO.:  [id number]  LOCATION:                                 FACILITY:  PHYSICIAN:  Mohammad I. Audie Wieser, M.D.DATE OF BIRTH:  09/18/1970  DATE OF ADMISSION:  09/05/2013 DATE OF DISCHARGE:  01/09/2015LH                             HISTORY & PHYSICAL   CHIEF COMPLAINT:  Recurrent left renal colic.  HISTORY:  A 43-year-old gentleman who presented to the emergency room with left renal colic, which he has had for the last 2-3 weeks.  No nausea, vomiting, fever, chills, or gross hematuria.  He was supposed to come to the office today, but the pain became worse, so he came to the emergency room.  He was treated with pain medicine and a CT scan was done.  When I saw him in the emergency room, his pain was under control. CT scan was reviewed by me.  It showed there was a large calculus located in the distal left ureter.  It appeared to be almost in the bladder, maybe it is intramural ureter.  There was a 3-mm stone at the ureterovesical junction on the same side.  This stone is at least 8-mm in size, but the radiologist did not describe the side.  He just said it was a large stone.  There is a 2 mm stone in the left kidney causing no obstruction.  There is no stone in the right kidney or the ureter. Initially, the pain was not under control.  We were going to admit him in the hospital to control the pain, but by the time I came and saw him in the emergency room, he was feeling better.  When I gave him the choice that he could go home his wife and they both agreed that rather they would go home.  I have told them that if he does not pass the stone by tomorrow, then I am going to take it out by ureteroscopic stone basket.  I explained then the procedure, its complications, especially ureteral perforation leading to open surgery.  He wanted to go back to work on Monday, so I believe if he stand with strength, if  everything was fine then I can take it out on Saturday in the emergency room.  He denies having any history of kidney stones in the past.  PAST MEDICAL HISTORY:  No history of diabetes or hypertension.  PERSONAL HISTORY:  He does not smoke or drink.  REVIEW OF SYSTEMS:  Unremarkable.  PHYSICAL EXAMINATION:  GENERAL:  Moderately built male.  At this point, not in acute distress, fully conscious, alert, oriented. VITAL SIGNS:  Blood pressure 130/80, temperature is normal. CENTRAL NERVOUS SYSTEM:  No gross neurological deficit. HEAD, NECK, EYE, ENT:  Negative. CHEST:  Symmetrical.  Normal breath sounds. HEART:  Regular sinus rhythm.  No murmur. ABDOMEN:  1+ left CVA tenderness.  External genitalia is circumcised. Meatus adequate.  Testicles are normal. RECTAL:  Deferred. EXTREMITIES:  Normal.  IMPRESSION:  Distal left ureteral calculi.  PLAN:  Ureteroscopic stone basket extraction, holmium laser lithotripsy, insertion of double-J stent.  I told him I will try to get these both stones out, but I   have not given him any guarantees.  I discussed with them the complication of this procedure, especially ureteral perforation, migration of stone, and use of double-J stent.  They understood and wanted me to go ahead and proceed.  He is coming as an outpatient tomorrow.  I will do this procedure, cystoscopy, ureteral stone extraction with ureteroscope, holmium laser lithotripsy, insertion of double-J stent under anesthesia as an outpatient.     Mohammad I. Jazlene Bares, M.D.     MIJ/MEDQ  D:  09/03/2013  T:  09/04/2013  Job:  [id number] 

## 2013-09-09 NOTE — H&P (Deleted)
NAMCarron [name]:  [name], Eric           ACCOUNT NO.:  [id number]  MEDICAL RECORD NO.:  [id number]  LOCATION:                                 FACILITY:  PHYSICIAN:  Ky BarbanMohammad I. Jozee Hammer, M.D.DATE OF BIRTH:  18-Nov-1970  DATE OF ADMISSION:  09/05/2013 DATE OF DISCHARGE:  01/09/2015LH                             HISTORY & PHYSICAL   CHIEF COMPLAINT:  Recurrent left renal colic.  HISTORY:  A 43 year old gentleman who presented to the emergency room with left renal colic, which he has had for the last 2-3 weeks.  No nausea, vomiting, fever, chills, or gross hematuria.  He was supposed to come to the office today, but the pain became worse, so he came to the emergency room.  He was treated with pain medicine and a CT scan was done.  When I saw him in the emergency room, his pain was under control. CT scan was reviewed by me.  It showed there was a large calculus located in the distal left ureter.  It appeared to be almost in the bladder, maybe it is intramural ureter.  There was a 3-mm stone at the ureterovesical junction on the same side.  This stone is at least 8-mm in size, but the radiologist did not describe the side.  He just said it was a large stone.  There is a 2 mm stone in the left kidney causing no obstruction.  There is no stone in the right kidney or the ureter. Initially, the pain was not under control.  We were going to admit him in the hospital to control the pain, but by the time I came and saw him in the emergency room, he was feeling better.  When I gave him the choice that he could go home his wife and they both agreed that rather they would go home.  I have told them that if he does not pass the stone by tomorrow, then I am going to take it out by ureteroscopic stone basket.  I explained then the procedure, its complications, especially ureteral perforation leading to open surgery.  He wanted to go back to work on Monday, so I believe if he stand with strength, if  everything was fine then I can take it out on Saturday in the emergency room.  He denies having any history of kidney stones in the past.  PAST MEDICAL HISTORY:  No history of diabetes or hypertension.  PERSONAL HISTORY:  He does not smoke or drink.  REVIEW OF SYSTEMS:  Unremarkable.  PHYSICAL EXAMINATION:  GENERAL:  Moderately built male.  At this point, not in acute distress, fully conscious, alert, oriented. VITAL SIGNS:  Blood pressure 130/80, temperature is normal. CENTRAL NERVOUS SYSTEM:  No gross neurological deficit. HEAD, NECK, EYE, ENT:  Negative. CHEST:  Symmetrical.  Normal breath sounds. HEART:  Regular sinus rhythm.  No murmur. ABDOMEN:  1+ left CVA tenderness.  External genitalia is circumcised. Meatus adequate.  Testicles are normal. RECTAL:  Deferred. EXTREMITIES:  Normal.  IMPRESSION:  Distal left ureteral calculi.  PLAN:  Ureteroscopic stone basket extraction, holmium laser lithotripsy, insertion of double-J stent.  I told him I will try to get these both stones out, but I  have not given him any guarantees.  I discussed with them the complication of this procedure, especially ureteral perforation, migration of stone, and use of double-J stent.  They understood and wanted me to go ahead and proceed.  He is coming as an outpatient tomorrow.  I will do this procedure, cystoscopy, ureteral stone extraction with ureteroscope, holmium laser lithotripsy, insertion of double-J stent under anesthesia as an outpatient.     Ky Barban, M.D.     MIJ/MEDQ  D:  09/03/2013  T:  09/04/2013  Job:  [id number]

## 2015-01-03 IMAGING — CT CT ABD-PELV W/O CM
2 of 3 series · 9 of 46 positions shown, 11 images · non-contrast
Comparison: CT abdomen and pelvis 08/19/2013.

CLINICAL DATA: Left flank pain.

EXAM:
CT ABDOMEN AND PELVIS WITHOUT CONTRAST
TECHNIQUE: Multidetector CT imaging of the abdomen and pelvis was performed
following the standard protocol without intravenous contrast.

[Series 4: mpr coronal (id) · coronal · 0.72mm/px · 8 of 90 slices shown, 9 images]
[im 10/90  soft-tissue]
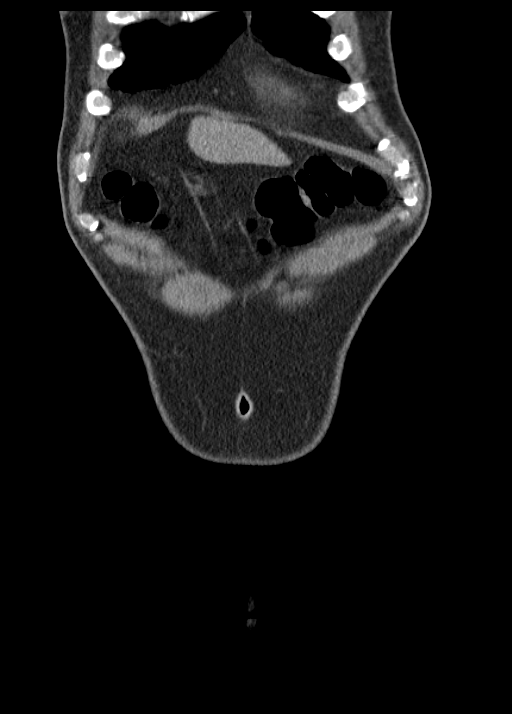
[im 10/90  bone]
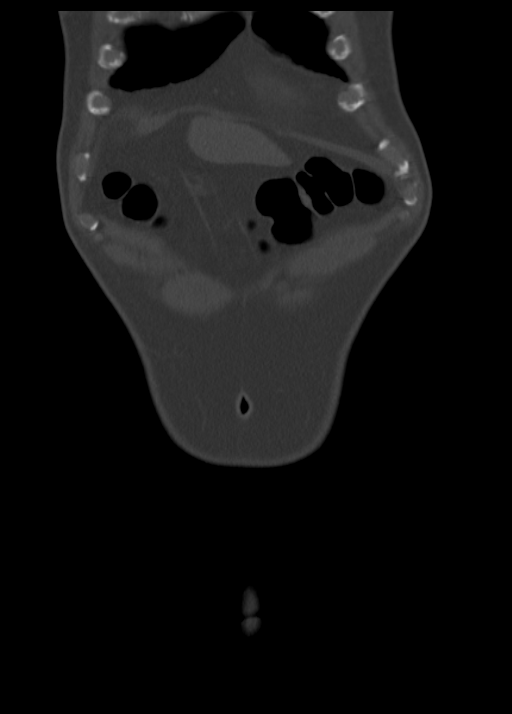
[im 20/90  soft-tissue]
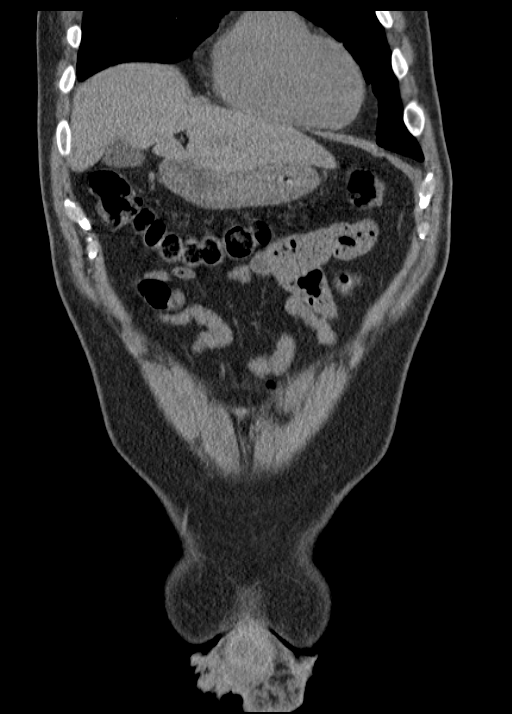
[im 30/90  soft-tissue]
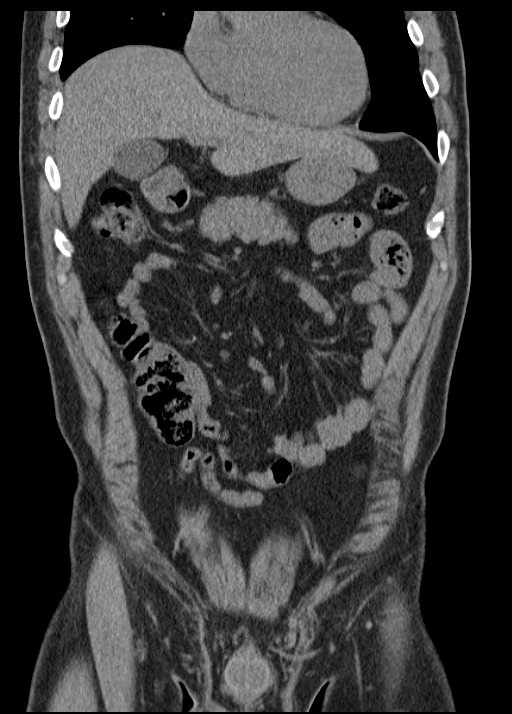
[im 40/90  soft-tissue]
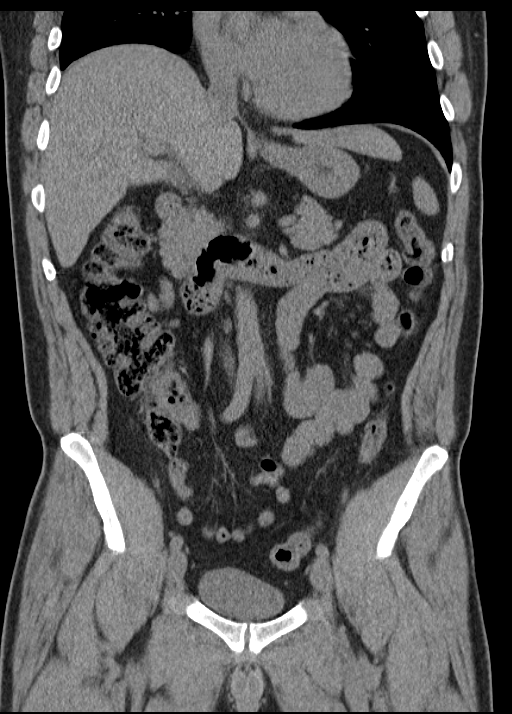
[im 50/90  soft-tissue]
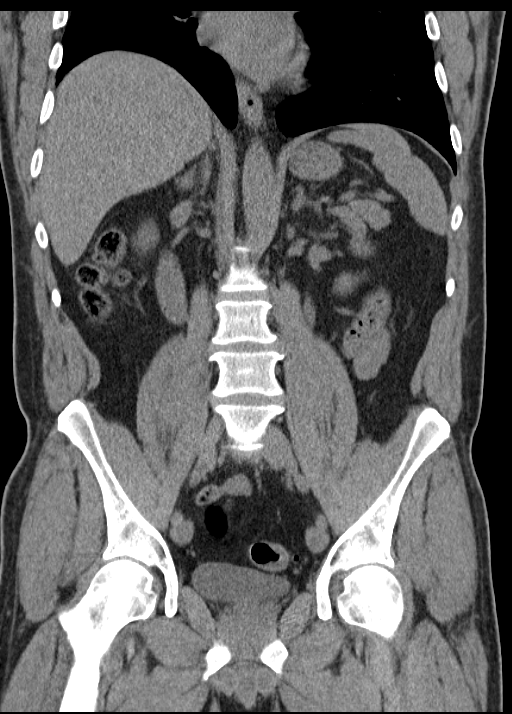
[im 60/90  soft-tissue]
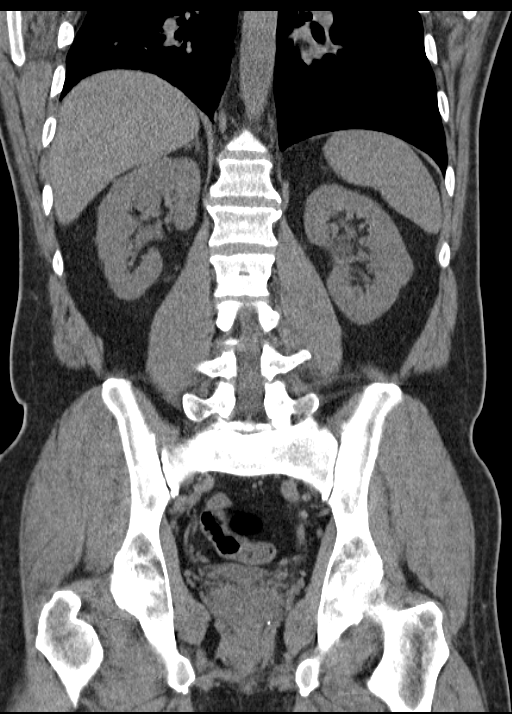
[im 70/90  soft-tissue]
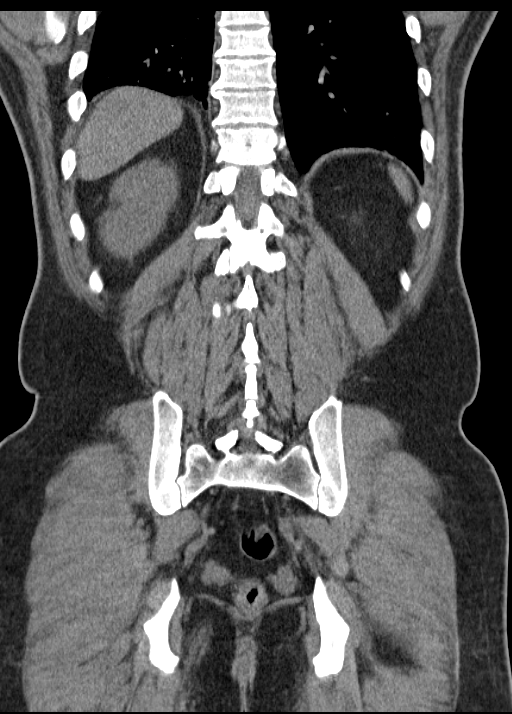
[im 80/90  soft-tissue]
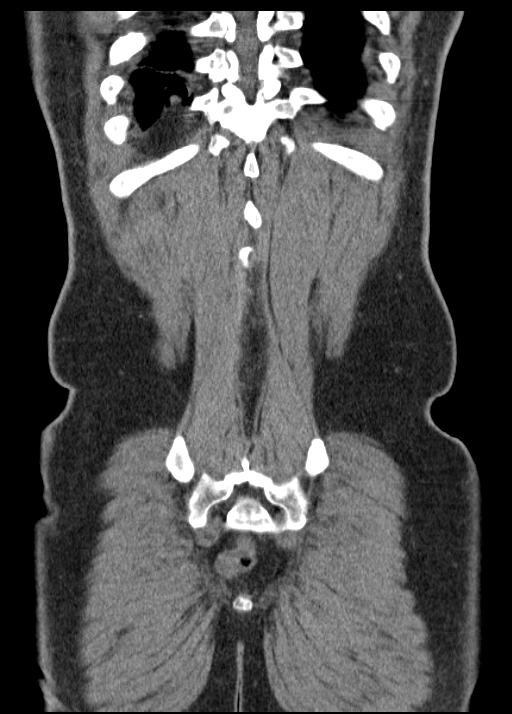

[Series 5: mpr sagittal (id) · sagittal · 0.49mm/px · 1 of 122 slices shown, 2 images]
[im 41/122  soft-tissue]
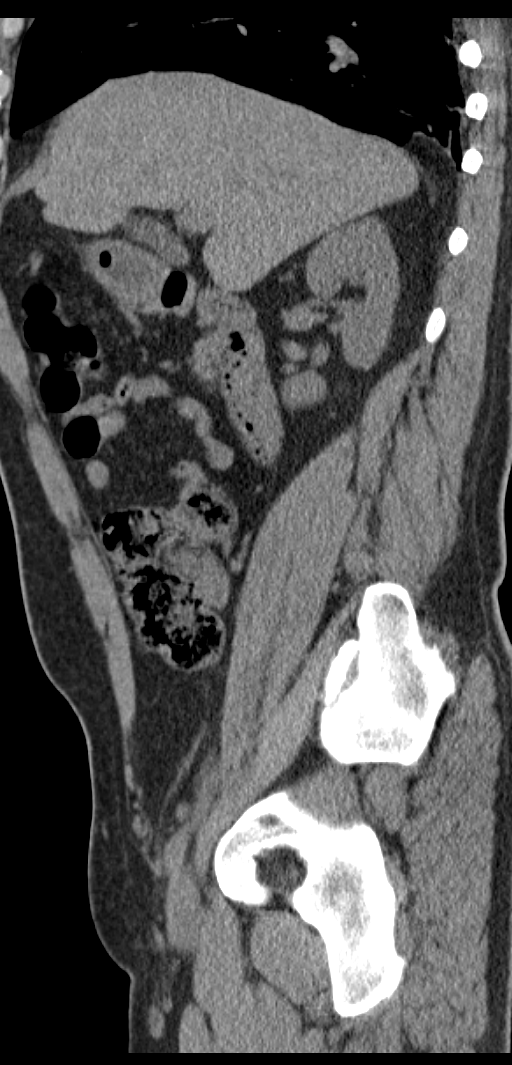
[im 41/122  bone]
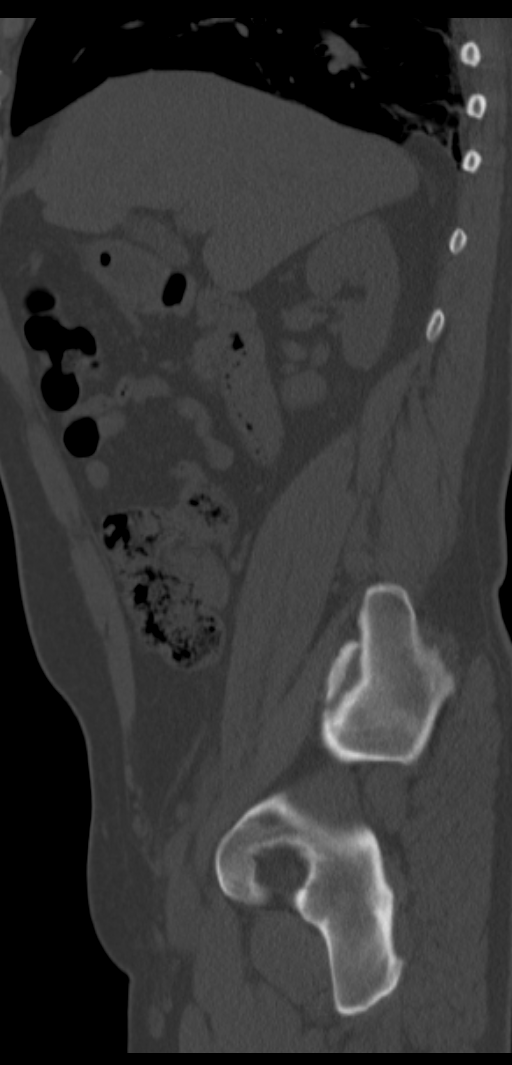

[9 of 46 positions shown; findings below may reference images not displayed]

FINDINGS: The lung bases are clear. No pleural or pericardial effusion. Small
Bochdalek's hernia on the right is noted.

There is mild left hydronephrosis which is markedly improved since
the prior study. The previously seen large distal left ureteral
stone has progressed and is now at the left UVJ or just within the
urinary bladder. The patient does have a punctate stone measuring
approximately 0.3 cm in the distal left ureter just proximal to the
left UVJ. A punctate nonobstructing stone is again seen in the upper
pole of the left kidney. No right urinary tract stones are
identified.

The liver, gallbladder, adrenal glands, spleen and pancreas all
appear normal. There is some sigmoid diverticular disease without
diverticulitis. The colon is otherwise unremarkable. The stomach,
small bowel and appendix appear normal. There is no lymphadenopathy
or fluid. No focal bony abnormality is identified.
IMPRESSION: Improved left hydronephrosis since the comparison study. Mild right
hydronephrosis is seen on today's examination. Previously seen large
distal left ureteral stone has progressed since the prior study and
is now at the left UVJ or just within the urinary bladder. The
patient does have a 0.3 cm distal left ureteral stone just proximal
to the UVJ.

No change in a punctate nonobstructing stone in the upper pole of
the left kidney.

Sigmoid diverticulosis without diverticulitis.

## 2015-02-15 ENCOUNTER — Emergency Department (HOSPITAL_COMMUNITY): Payer: BLUE CROSS/BLUE SHIELD

## 2015-02-15 ENCOUNTER — Emergency Department (HOSPITAL_COMMUNITY)
Admission: EM | Admit: 2015-02-15 | Discharge: 2015-02-15 | Disposition: A | Discharge status: Home / Self Care | Payer: BLUE CROSS/BLUE SHIELD | Attending: Emergency Medicine | Admitting: Emergency Medicine

## 2015-02-15 ENCOUNTER — Encounter (HOSPITAL_COMMUNITY): Payer: Self-pay | Admitting: *Deleted

## 2015-02-15 DIAGNOSIS — K219 Gastro-esophageal reflux disease without esophagitis: Secondary | ICD-10-CM | POA: Insufficient documentation

## 2015-02-15 DIAGNOSIS — G8929 Other chronic pain: Secondary | ICD-10-CM | POA: Insufficient documentation

## 2015-02-15 DIAGNOSIS — G43909 Migraine, unspecified, not intractable, without status migrainosus: Secondary | ICD-10-CM | POA: Insufficient documentation

## 2015-02-15 DIAGNOSIS — M5442 Lumbago with sciatica, left side: Secondary | ICD-10-CM

## 2015-02-15 DIAGNOSIS — Z720 Tobacco use: Secondary | ICD-10-CM | POA: Insufficient documentation

## 2015-02-15 DIAGNOSIS — Z87448 Personal history of other diseases of urinary system: Secondary | ICD-10-CM | POA: Insufficient documentation

## 2015-02-15 DIAGNOSIS — M545 Low back pain: Secondary | ICD-10-CM | POA: Diagnosis present

## 2015-02-15 DIAGNOSIS — Z8781 Personal history of (healed) traumatic fracture: Secondary | ICD-10-CM | POA: Diagnosis not present

## 2015-02-15 DIAGNOSIS — Z79899 Other long term (current) drug therapy: Secondary | ICD-10-CM | POA: Insufficient documentation

## 2015-02-15 DIAGNOSIS — Z791 Long term (current) use of non-steroidal anti-inflammatories (NSAID): Secondary | ICD-10-CM | POA: Insufficient documentation

## 2015-02-15 MED ORDER — OXYCODONE-ACETAMINOPHEN 5-325 MG PO TABS
2.0000 | ORAL_TABLET | Freq: Once | ORAL | Status: AC
  Administered 2015-02-15: 2 via ORAL
  Filled 2015-02-15: qty 2

## 2015-02-15 MED ORDER — OXYCODONE-ACETAMINOPHEN 5-325 MG PO TABS: 2.0000 | ORAL_TABLET | ORAL | Status: DC | PRN

## 2015-02-15 MED ORDER — PREDNISONE 10 MG PO TABS: ORAL_TABLET | ORAL | Status: DC

## 2015-02-15 NOTE — Discharge Instructions (Signed)
Back Pain, Adult °Back pain is very common. The pain often gets better over time. The cause of back pain is usually not dangerous. Most people can learn to manage their back pain on their own.  °HOME CARE  °· Stay active. Start with short walks on flat ground if you can. Try to walk farther each day. °· Do not sit, drive, or stand in one place for more than 30 minutes. Do not stay in bed. °· Do not avoid exercise or work. Activity can help your back heal faster. °· Be careful when you bend or lift an object. Bend at your knees, keep the object close to you, and do not twist. °· Sleep on a firm mattress. Lie on your side, and bend your knees. If you lie on your back, put a pillow under your knees. °· Only take medicines as told by your doctor. °· Put ice on the injured area. °¨ Put ice in a plastic bag. °¨ Place a towel between your skin and the bag. °¨ Leave the ice on for 15-20 minutes, 03-04 times a day for the first 2 to 3 days. After that, you can switch between ice and heat packs. °· Ask your doctor about back exercises or massage. °· Avoid feeling anxious or stressed. Find good ways to deal with stress, such as exercise. °GET HELP RIGHT AWAY IF:  °· Your pain does not go away with rest or medicine. °· Your pain does not go away in 1 week. °· You have new problems. °· You do not feel well. °· The pain spreads into your legs. °· You cannot control when you poop (bowel movement) or pee (urinate). °· Your arms or legs feel weak or lose feeling (numbness). °· You feel sick to your stomach (nauseous) or throw up (vomit). °· You have belly (abdominal) pain. °· You feel like you may pass out (faint). °MAKE SURE YOU:  °· Understand these instructions. °· Will watch your condition. °· Will get help right away if you are not doing well or get worse. °Document Released: 01/31/2008 Document Revised: 11/06/2011 Document Reviewed: 12/16/2013 °ExitCare® Patient Information ©2015 [employer]. This information is not intended  to replace advice given to you by your health care provider. Make sure you discuss any questions you have with your health care provider. ° °
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
## 2015-02-15 NOTE — ED Notes (Signed)
PA at bedside.

## 2015-02-15 NOTE — ED Notes (Signed)
Low back pain since yesterday,  Numbness in lt leg.

## 2015-02-24 ENCOUNTER — Emergency Department (HOSPITAL_COMMUNITY)
Admission: EM | Admit: 2015-02-24 | Discharge: 2015-02-24 | Disposition: A | Discharge status: Home / Self Care | Payer: BLUE CROSS/BLUE SHIELD | Attending: Emergency Medicine | Admitting: Emergency Medicine

## 2015-02-24 ENCOUNTER — Encounter (HOSPITAL_COMMUNITY): Payer: Self-pay | Admitting: Emergency Medicine

## 2015-02-24 DIAGNOSIS — Z720 Tobacco use: Secondary | ICD-10-CM | POA: Diagnosis not present

## 2015-02-24 DIAGNOSIS — K219 Gastro-esophageal reflux disease without esophagitis: Secondary | ICD-10-CM | POA: Diagnosis not present

## 2015-02-24 DIAGNOSIS — M5432 Sciatica, left side: Secondary | ICD-10-CM

## 2015-02-24 DIAGNOSIS — Z87828 Personal history of other (healed) physical injury and trauma: Secondary | ICD-10-CM | POA: Insufficient documentation

## 2015-02-24 DIAGNOSIS — M545 Low back pain: Secondary | ICD-10-CM | POA: Diagnosis present

## 2015-02-24 DIAGNOSIS — Z79899 Other long term (current) drug therapy: Secondary | ICD-10-CM | POA: Diagnosis not present

## 2015-02-24 DIAGNOSIS — Z8679 Personal history of other diseases of the circulatory system: Secondary | ICD-10-CM | POA: Diagnosis not present

## 2015-02-24 DIAGNOSIS — Z8781 Personal history of (healed) traumatic fracture: Secondary | ICD-10-CM | POA: Insufficient documentation

## 2015-02-24 DIAGNOSIS — G8929 Other chronic pain: Secondary | ICD-10-CM | POA: Diagnosis not present

## 2015-02-24 DIAGNOSIS — Z8709 Personal history of other diseases of the respiratory system: Secondary | ICD-10-CM | POA: Insufficient documentation

## 2015-02-24 DIAGNOSIS — M62830 Muscle spasm of back: Secondary | ICD-10-CM | POA: Insufficient documentation

## 2015-02-24 MED ORDER — DEXAMETHASONE SODIUM PHOSPHATE 4 MG/ML IJ SOLN
8.0000 mg | Freq: Once | INTRAMUSCULAR | Status: AC
  Administered 2015-02-24: 8 mg via INTRAMUSCULAR
  Filled 2015-02-24: qty 2

## 2015-02-24 MED ORDER — ONDANSETRON HCL 4 MG PO TABS
4.0000 mg | ORAL_TABLET | Freq: Once | ORAL | Status: AC
  Administered 2015-02-24: 4 mg via ORAL
  Filled 2015-02-24: qty 1

## 2015-02-24 MED ORDER — MELOXICAM 15 MG PO TABS: 15.0000 mg | ORAL_TABLET | Freq: Every day | ORAL | Status: DC

## 2015-02-24 MED ORDER — FENTANYL CITRATE (PF) 100 MCG/2ML IJ SOLN
100.0000 ug | Freq: Once | INTRAMUSCULAR | Status: AC
  Administered 2015-02-24: 100 ug via INTRAMUSCULAR
  Filled 2015-02-24: qty 2

## 2015-02-24 MED ORDER — IBUPROFEN 800 MG PO TABS
800.0000 mg | ORAL_TABLET | Freq: Once | ORAL | Status: AC
  Administered 2015-02-24: 800 mg via ORAL
  Filled 2015-02-24: qty 1

## 2015-02-24 MED ORDER — DEXAMETHASONE 4 MG PO TABS: 4.0000 mg | ORAL_TABLET | Freq: Two times a day (BID) | ORAL | Status: DC

## 2015-02-24 MED ORDER — OXYCODONE-ACETAMINOPHEN 5-325 MG PO TABS
1.0000 | ORAL_TABLET | Freq: Four times a day (QID) | ORAL | Status: DC | PRN
Start: 2015-02-24 — End: 2015-03-07
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
## 2015-02-24 NOTE — ED Notes (Signed)
Pt c/o lower back pain with pain/numbness radiating down left leg. Pt states he was seen in ED last week for same.
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
## 2015-02-24 NOTE — Discharge Instructions (Signed)
Heating pad to the lower back may be helpful. Please use mobic daily with food. Use percocet with caution, as it may cause drowsiness. Please see Dr Loney HeringBluth on Friday at 11am. Please call the office tomorrow to confirm the appointment. Sciatica Sciatica is pain, weakness, numbness, or tingling along your sciatic nerve. The nerve starts in the lower back and runs down the back of each leg. Nerve damage or certain conditions pinch or put pressure on the sciatic nerve. This causes the pain, weakness, and other discomforts of sciatica. HOME CARE   Only take medicine as told by your doctor.  Apply ice to the affected area for 20 minutes. Do this 3-4 times a day for the first 48-72 hours. Then try heat in the same way.  Exercise, stretch, or do your usual activities if these do not make your pain worse.  Go to physical therapy as told by your doctor.  Keep all doctor visits as told.  Do not wear high heels or shoes that are not supportive.  Get a firm mattress if your mattress is too soft to lessen pain and discomfort. GET HELP RIGHT AWAY IF:   You cannot control when you poop (bowel movement) or pee (urinate).  You have more weakness in your lower back, lower belly (pelvis), butt (buttocks), or legs.  You have redness or puffiness (swelling) of your back.  You have a burning feeling when you pee.  You have pain that gets worse when you lie down.  You have pain that wakes you from your sleep.  Your pain is worse than past pain.  Your pain lasts longer than 4 weeks.  You are suddenly losing weight without reason. MAKE SURE YOU:   Understand these instructions.  Will watch this condition.  Will get help right away if you are not doing well or get worse. Document Released: 05/23/2008 Document Revised: 02/13/2012 Document Reviewed: 12/24/2011 [facility] Patient Information 2015 [employer], [place]. This information is not intended to replace advice given to you by your health care  provider. Make sure you discuss any questions you have with your health care provider.

## 2015-03-07 ENCOUNTER — Encounter (HOSPITAL_COMMUNITY): Payer: Self-pay | Admitting: Emergency Medicine

## 2015-03-07 ENCOUNTER — Emergency Department (HOSPITAL_COMMUNITY)
Admission: EM | Admit: 2015-03-07 | Discharge: 2015-03-07 | Disposition: A | Discharge status: Home / Self Care | Payer: BLUE CROSS/BLUE SHIELD | Attending: Emergency Medicine | Admitting: Emergency Medicine

## 2015-03-07 DIAGNOSIS — Z8679 Personal history of other diseases of the circulatory system: Secondary | ICD-10-CM | POA: Insufficient documentation

## 2015-03-07 DIAGNOSIS — Z79899 Other long term (current) drug therapy: Secondary | ICD-10-CM | POA: Insufficient documentation

## 2015-03-07 DIAGNOSIS — Z791 Long term (current) use of non-steroidal anti-inflammatories (NSAID): Secondary | ICD-10-CM | POA: Insufficient documentation

## 2015-03-07 DIAGNOSIS — G8929 Other chronic pain: Secondary | ICD-10-CM | POA: Diagnosis not present

## 2015-03-07 DIAGNOSIS — Z8781 Personal history of (healed) traumatic fracture: Secondary | ICD-10-CM | POA: Insufficient documentation

## 2015-03-07 DIAGNOSIS — Z87448 Personal history of other diseases of urinary system: Secondary | ICD-10-CM | POA: Insufficient documentation

## 2015-03-07 DIAGNOSIS — Z8709 Personal history of other diseases of the respiratory system: Secondary | ICD-10-CM | POA: Diagnosis not present

## 2015-03-07 DIAGNOSIS — Z720 Tobacco use: Secondary | ICD-10-CM | POA: Insufficient documentation

## 2015-03-07 DIAGNOSIS — M5442 Lumbago with sciatica, left side: Secondary | ICD-10-CM | POA: Diagnosis not present

## 2015-03-07 DIAGNOSIS — K219 Gastro-esophageal reflux disease without esophagitis: Secondary | ICD-10-CM | POA: Insufficient documentation

## 2015-03-07 DIAGNOSIS — M545 Low back pain: Secondary | ICD-10-CM | POA: Diagnosis present

## 2015-03-07 MED ORDER — HYDROMORPHONE HCL 1 MG/ML IJ SOLN: 1.0000 mg | Freq: Once | INTRAMUSCULAR | Status: DC

## 2015-03-07 MED ORDER — OXYCODONE-ACETAMINOPHEN 5-325 MG PO TABS: 1.0000 | ORAL_TABLET | Freq: Four times a day (QID) | ORAL | Status: DC | PRN

## 2015-03-07 MED ORDER — HYDROMORPHONE HCL 1 MG/ML IJ SOLN
1.0000 mg | Freq: Once | INTRAMUSCULAR | Status: AC
  Administered 2015-03-07: 1 mg via INTRAMUSCULAR
  Filled 2015-03-07: qty 1

## 2015-03-07 NOTE — ED Notes (Signed)
Patient c/o lower back pain that radiates into left leg for 2 months. Per patient had MRI on Thursday in which the patient has results. Patient reports taking muscle relaxer and pain medication with some relief briefly. Patient also reported that he had pain in right shoulder but it is now in left shoulder. Patient has to see neurologist but had to have MRI first, has not been able to set up appointment yet.

## 2015-03-07 NOTE — Discharge Instructions (Signed)
You must follow-up with your primary care doctor.  Pain meds
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
## 2017-02-16 ENCOUNTER — Ambulatory Visit: Payer: BLUE CROSS/BLUE SHIELD | Admitting: Family Medicine

## 2019-11-07 ENCOUNTER — Ambulatory Visit: Payer: BLUE CROSS/BLUE SHIELD | Admitting: Urology

## 2020-02-20 ENCOUNTER — Ambulatory Visit: Payer: BLUE CROSS/BLUE SHIELD | Admitting: Urology

## 2020-04-02 ENCOUNTER — Ambulatory Visit: Payer: BLUE CROSS/BLUE SHIELD | Admitting: Urology

## 2020-06-04 ENCOUNTER — Ambulatory Visit: Payer: BLUE CROSS/BLUE SHIELD | Admitting: Urology

## 2020-09-16 ENCOUNTER — Ambulatory Visit: Payer: BLUE CROSS/BLUE SHIELD | Admitting: Urology

## 2020-09-17 ENCOUNTER — Ambulatory Visit: Payer: BLUE CROSS/BLUE SHIELD | Admitting: Urology

## 2020-11-04 ENCOUNTER — Ambulatory Visit: Payer: BLUE CROSS/BLUE SHIELD | Admitting: Urology

## 2020-12-17 ENCOUNTER — Ambulatory Visit: Payer: BLUE CROSS/BLUE SHIELD | Admitting: Urology

## 2021-04-26 ENCOUNTER — Encounter: Payer: Self-pay | Admitting: Internal Medicine
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
## 2021-09-14 ENCOUNTER — Ambulatory Visit: Payer: BLUE CROSS/BLUE SHIELD | Admitting: Gastroenterology

## 2022-10-27 ENCOUNTER — Ambulatory Visit: Payer: BLUE CROSS/BLUE SHIELD | Admitting: Urology

## 2022-12-08 ENCOUNTER — Ambulatory Visit: Payer: BLUE CROSS/BLUE SHIELD | Admitting: Urology

## 2023-01-24 ENCOUNTER — Ambulatory Visit (INDEPENDENT_AMBULATORY_CARE_PROVIDER_SITE_OTHER): Payer: 59 | Admitting: Urology

## 2023-01-24 ENCOUNTER — Encounter: Payer: Self-pay | Admitting: Urology

## 2023-01-24 VITALS — BP 109/75 | HR 83

## 2023-01-24 DIAGNOSIS — N486 Induration penis plastica: Secondary | ICD-10-CM
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
## 2023-01-25 LAB — URINALYSIS, ROUTINE W REFLEX MICROSCOPIC
Bilirubin, UA: NEGATIVE
Glucose, UA: NEGATIVE
Ketones, UA: NEGATIVE
Leukocytes,UA: NEGATIVE
Nitrite, UA: NEGATIVE
Protein,UA: NEGATIVE
RBC, UA: NEGATIVE
Specific Gravity, UA: 1.03 (ref 1.005–1.030)
Urobilinogen, Ur: 0.2 mg/dL (ref 0.2–1.0)
pH, UA: 5.5 (ref 5.0–7.5)

## 2023-05-21 ENCOUNTER — Other Ambulatory Visit: Payer: Self-pay

## 2023-05-21 ENCOUNTER — Encounter (HOSPITAL_COMMUNITY): Payer: Self-pay | Admitting: Radiology

## 2023-05-21 ENCOUNTER — Emergency Department (HOSPITAL_COMMUNITY): Payer: 59

## 2023-05-21 ENCOUNTER — Inpatient Hospital Stay (HOSPITAL_COMMUNITY)
Admission: EM | Admit: 2023-05-21 | Discharge: 2023-05-23 | DRG: 638 | Disposition: A | Discharge status: Home / Self Care | Payer: 59 | Attending: Family Medicine | Admitting: Family Medicine

## 2023-05-21 DIAGNOSIS — F1120 Opioid dependence, uncomplicated: Secondary | ICD-10-CM | POA: Diagnosis present

## 2023-05-21 DIAGNOSIS — R0789 Other chest pain: Secondary | ICD-10-CM | POA: Insufficient documentation

## 2023-05-21 DIAGNOSIS — K219 Gastro-esophageal reflux disease without esophagitis: Secondary | ICD-10-CM | POA: Diagnosis present

## 2023-05-21 DIAGNOSIS — F17210 Nicotine dependence, cigarettes, uncomplicated: Secondary | ICD-10-CM | POA: Diagnosis present

## 2023-05-21 DIAGNOSIS — D72823 Leukemoid reaction: Secondary | ICD-10-CM | POA: Diagnosis not present

## 2023-05-21 DIAGNOSIS — K5730 Diverticulosis of large intestine without perforation or abscess without bleeding: Secondary | ICD-10-CM | POA: Diagnosis present

## 2023-05-21 DIAGNOSIS — Z720 Tobacco use: Secondary | ICD-10-CM | POA: Insufficient documentation

## 2023-05-21 DIAGNOSIS — E871 Hypo-osmolality and hyponatremia: Principal | ICD-10-CM | POA: Diagnosis present

## 2023-05-21 DIAGNOSIS — Z87442 Personal history of urinary calculi: Secondary | ICD-10-CM

## 2023-05-21 DIAGNOSIS — Z885 Allergy status to narcotic agent status: Secondary | ICD-10-CM

## 2023-05-21 DIAGNOSIS — I10 Essential (primary) hypertension: Secondary | ICD-10-CM | POA: Diagnosis present

## 2023-05-21 DIAGNOSIS — R739 Hyperglycemia, unspecified: Secondary | ICD-10-CM | POA: Insufficient documentation

## 2023-05-21 DIAGNOSIS — E876 Hypokalemia: Secondary | ICD-10-CM | POA: Insufficient documentation

## 2023-05-21 DIAGNOSIS — D72829 Elevated white blood cell count, unspecified: Secondary | ICD-10-CM | POA: Diagnosis not present

## 2023-05-21 DIAGNOSIS — Z888 Allergy status to other drugs, medicaments and biological substances status: Secondary | ICD-10-CM | POA: Diagnosis not present

## 2023-05-21 DIAGNOSIS — F419 Anxiety disorder, unspecified: Secondary | ICD-10-CM | POA: Diagnosis present

## 2023-05-21 DIAGNOSIS — M549 Dorsalgia, unspecified: Secondary | ICD-10-CM | POA: Diagnosis present

## 2023-05-21 DIAGNOSIS — R079 Chest pain, unspecified: Secondary | ICD-10-CM | POA: Diagnosis present

## 2023-05-21 DIAGNOSIS — E1165 Type 2 diabetes mellitus with hyperglycemia: Secondary | ICD-10-CM | POA: Diagnosis present

## 2023-05-21 DIAGNOSIS — E785 Hyperlipidemia, unspecified: Secondary | ICD-10-CM | POA: Diagnosis present

## 2023-05-21 DIAGNOSIS — Z79899 Other long term (current) drug therapy: Secondary | ICD-10-CM

## 2023-05-21 DIAGNOSIS — G8929 Other chronic pain: Secondary | ICD-10-CM | POA: Diagnosis present

## 2023-05-21 DIAGNOSIS — E538 Deficiency of other specified B group vitamins: Secondary | ICD-10-CM | POA: Diagnosis present

## 2023-05-21 DIAGNOSIS — I160 Hypertensive urgency: Secondary | ICD-10-CM | POA: Diagnosis present

## 2023-05-21 LAB — CBC WITH DIFFERENTIAL/PLATELET
Abs Immature Granulocytes: 0.1 10*3/uL — ABNORMAL HIGH (ref 0.00–0.07)
Basophils Absolute: 0.1 10*3/uL (ref 0.0–0.1)
Basophils Relative: 0 %
Eosinophils Absolute: 0.1 10*3/uL (ref 0.0–0.5)
Eosinophils Relative: 0 %
HCT: 43 % (ref 39.0–52.0)
Hemoglobin: 15.3 g/dL (ref 13.0–17.0)
Immature Granulocytes: 1 %
Lymphocytes Relative: 16 %
Lymphs Abs: 2.7 10*3/uL (ref 0.7–4.0)
MCH: 29.9 pg (ref 26.0–34.0)
MCHC: 35.6 g/dL (ref 30.0–36.0)
MCV: 84 fL (ref 80.0–100.0)
Monocytes Absolute: 0.8 10*3/uL (ref 0.1–1.0)
Monocytes Relative: 5 %
Neutro Abs: 13.6 10*3/uL — ABNORMAL HIGH (ref 1.7–7.7)
Neutrophils Relative %: 78 %
Platelets: 265 10*3/uL (ref 150–400)
RBC: 5.12 MIL/uL (ref 4.22–5.81)
RDW: 11.5 % (ref 11.5–15.5)
WBC: 17.3 10*3/uL — ABNORMAL HIGH (ref 4.0–10.5)
nRBC: 0 % (ref 0.0–0.2)

## 2023-05-21 LAB — COMPREHENSIVE METABOLIC PANEL
ALT: 37 U/L (ref 0–44)
AST: 42 U/L — ABNORMAL HIGH (ref 15–41)
Albumin: 4.2 g/dL (ref 3.5–5.0)
Alkaline Phosphatase: 48 U/L (ref 38–126)
Anion gap: 15 (ref 5–15)
BUN: 6 mg/dL (ref 6–20)
CO2: 19 mmol/L — ABNORMAL LOW (ref 22–32)
Calcium: 8.2 mg/dL — ABNORMAL LOW (ref 8.9–10.3)
Chloride: 86 mmol/L — ABNORMAL LOW (ref 98–111)
Creatinine, Ser: 0.6 mg/dL — ABNORMAL LOW (ref 0.61–1.24)
GFR, Estimated: >60 mL/min (ref >60)
Glucose, Bld: 192 mg/dL — ABNORMAL HIGH (ref 70–99)
Potassium: 2.9 mmol/L — ABNORMAL LOW (ref 3.5–5.1)
Sodium: 120 mmol/L — ABNORMAL LOW (ref 135–145)
Total Bilirubin: 1 mg/dL (ref 0.3–1.2)
Total Protein: 7.4 g/dL (ref 6.5–8.1)

## 2023-05-21 LAB — BASIC METABOLIC PANEL
Anion gap: 14 (ref 5–15)
Anion gap: 14 (ref 5–15)
BUN: 5 mg/dL — ABNORMAL LOW (ref 6–20)
BUN: 5 mg/dL — ABNORMAL LOW (ref 6–20)
CO2: 18 mmol/L — ABNORMAL LOW (ref 22–32)
CO2: 19 mmol/L — ABNORMAL LOW (ref 22–32)
Calcium: 7.8 mg/dL — ABNORMAL LOW (ref 8.9–10.3)
Calcium: 8.1 mg/dL — ABNORMAL LOW (ref 8.9–10.3)
Chloride: 88 mmol/L — ABNORMAL LOW (ref 98–111)
Chloride: 86 mmol/L — ABNORMAL LOW (ref 98–111)
Creatinine, Ser: 0.47 mg/dL — ABNORMAL LOW (ref 0.61–1.24)
Creatinine, Ser: 0.53 mg/dL — ABNORMAL LOW (ref 0.61–1.24)
GFR, Estimated: >60 mL/min (ref >60)
GFR, Estimated: >60 mL/min (ref >60)
Glucose, Bld: 170 mg/dL — ABNORMAL HIGH (ref 70–99)
Glucose, Bld: 194 mg/dL — ABNORMAL HIGH (ref 70–99)
Potassium: 2.8 mmol/L — ABNORMAL LOW (ref 3.5–5.1)
Potassium: 2.8 mmol/L — ABNORMAL LOW (ref 3.5–5.1)
Sodium: 120 mmol/L — ABNORMAL LOW (ref 135–145)
Sodium: 119 mmol/L — CL (ref 135–145)

## 2023-05-21 LAB — I-STAT CHEM 8, ED
BUN: 3 mg/dL — ABNORMAL LOW (ref 6–20)
Calcium, Ion: 0.91 mmol/L — ABNORMAL LOW (ref 1.15–1.40)
Chloride: 88 mmol/L — ABNORMAL LOW (ref 98–111)
Creatinine, Ser: 0.4 mg/dL — ABNORMAL LOW (ref 0.61–1.24)
Glucose, Bld: 170 mg/dL — ABNORMAL HIGH (ref 70–99)
HCT: 47 % (ref 39.0–52.0)
Hemoglobin: 16 g/dL (ref 13.0–17.0)
Potassium: 3 mmol/L — ABNORMAL LOW (ref 3.5–5.1)
Sodium: 123 mmol/L — ABNORMAL LOW (ref 135–145)
TCO2: 18 mmol/L — ABNORMAL LOW (ref 22–32)

## 2023-05-21 LAB — SODIUM, URINE, RANDOM: Sodium, Ur: 33 mmol/L

## 2023-05-21 LAB — TROPONIN I (HIGH SENSITIVITY)
Troponin I (High Sensitivity): 7 ng/L (ref <18)
Troponin I (High Sensitivity): 10 ng/L (ref <18)

## 2023-05-21 MED ORDER — LABETALOL HCL 5 MG/ML IV SOLN
10.0000 mg | Freq: Once | INTRAVENOUS | Status: AC
  Administered 2023-05-21: 10 mg via INTRAVENOUS
  Filled 2023-05-21: qty 4

## 2023-05-21 MED ORDER — IOHEXOL 300 MG/ML  SOLN: 100.0000 mL | Freq: Once | INTRAMUSCULAR | Status: DC | PRN

## 2023-05-21 MED ORDER — IOHEXOL 350 MG/ML SOLN
100.0000 mL | Freq: Once | INTRAVENOUS | Status: AC | PRN
  Administered 2023-05-21: 100 mL via INTRAVENOUS

## 2023-05-21 MED ORDER — HYDROMORPHONE HCL 1 MG/ML IJ SOLN
1.0000 mg | Freq: Once | INTRAMUSCULAR | Status: AC
  Administered 2023-05-21: 1 mg via INTRAVENOUS
  Filled 2023-05-21: qty 1

## 2023-05-21 MED ORDER — NICARDIPINE HCL IN NACL 20-0.86 MG/200ML-% IV SOLN
3.0000 mg/h | INTRAVENOUS | Status: DC
  Administered 2023-05-22 (×3): 5 mg/h via INTRAVENOUS
  Filled 2023-05-21 (×2): qty 200

## 2023-05-21 MED ORDER — SODIUM CHLORIDE 0.9 % IV BOLUS
1000.0000 mL | Freq: Once | INTRAVENOUS | Status: AC
  Administered 2023-05-21: 1000 mL via INTRAVENOUS

## 2023-05-21 MED ORDER — POTASSIUM CHLORIDE CRYS ER 20 MEQ PO TBCR
40.0000 meq | EXTENDED_RELEASE_TABLET | Freq: Once | ORAL | Status: AC
  Administered 2023-05-21: 40 meq via ORAL
  Filled 2023-05-21: qty 2

## 2023-05-21 MED ORDER — CHLORHEXIDINE GLUCONATE CLOTH 2 % EX PADS
6.0000 | MEDICATED_PAD | Freq: Every day | CUTANEOUS | Status: DC
  Administered 2023-05-21 – 2023-05-23 (×3): 6 via TOPICAL

## 2023-05-21 MED ORDER — DIAZEPAM 5 MG/ML IJ SOLN
5.0000 mg | Freq: Once | INTRAMUSCULAR | Status: AC
  Administered 2023-05-21: 5 mg via INTRAVENOUS
  Filled 2023-05-21: qty 2

## 2023-05-21 MED ORDER — POTASSIUM CHLORIDE 10 MEQ/100ML IV SOLN
10.0000 meq | Freq: Once | INTRAVENOUS | Status: AC
  Administered 2023-05-21: 10 meq via INTRAVENOUS
  Filled 2023-05-21: qty 100

## 2023-05-21 MED ORDER — SODIUM CHLORIDE 0.9 % IV SOLN: INTRAVENOUS | Status: DC

## 2023-05-21 MED ORDER — ASPIRIN 81 MG PO CHEW
324.0000 mg | CHEWABLE_TABLET | Freq: Once | ORAL | Status: AC
  Administered 2023-05-21: 324 mg via ORAL
  Filled 2023-05-21: qty 4
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
## 2023-05-21 NOTE — ED Notes (Addendum)
Pt appears diaphoretic and is breathing rapidly at this time, also states "my hypertension, my head hurts so bad"--MD made aware

## 2023-05-21 NOTE — ED Notes (Signed)
Patient's wife stated she had noticed that the pt has been having trouble being able to raise his arms. MD notified.

## 2023-05-21 NOTE — ED Triage Notes (Signed)
PT brought in VIA [facility], Patient stated has drank 20 bottles of water, fells bad. Complains of HTN, headache, chest pain dull upper Left non-radiating.

## 2023-05-21 NOTE — ED Notes (Signed)
EKG given to MD

## 2023-05-21 NOTE — ED Notes (Addendum)
Pts whole body appears to be contracted, pt able to verbalize what is going on, c/o chest pressure/tightness and headache, unable to follow commands fully due to poor attention, appears to be experiencing anxiety. Attmpted to perform NIH, pt unable to raise arms fully bilaterally and states "I can't" when asked to push feet on my hands

## 2023-05-21 NOTE — ED Provider Notes (Signed)
[facility]PARTMENT AT [facility] Provider Note   CSN: [id number] Arrival date & time: 05/21/23  1736     History  Chief Complaint  Patient presents with   Chest Pain    Eric [name] is a 52 y.o. male.  HPI 52 male presents with a chief complaint of chest pain and tightness.  Symptoms started around 11 AM.  He also developed an occipital headache at the same time.  Symptoms have progressively worsened.  He also feels diffusely weak and tremulous.  Feels like his has shortness of breath.  Has chronic back pain that is unchanged.  EMS was called and his blood pressure was very high, 230 over some number (family is not sure exactly).  He does not normally check his blood pressure at home but is on something for blood pressure.    Drank large amounts of water today after he started feeling bad.  Home Medications Prior to Admission medications   Medication Sig Start Date End Date Taking? Authorizing Provider  ALPRAZolam Prudy Feeler) 1 MG tablet Take 1 mg by mouth 2 (two) times daily.    [provider]  omeprazole (PRILOSEC) 20 MG capsule Take 20 mg by mouth daily.    [provider]  oxyCODONE-acetaminophen (PERCOCET/ROXICET) 5-325 MG per tablet Take 1-2 tablets by mouth every 6 (six) hours as needed. 03/07/15   Donnetta Hutching, MD  tiZANidine (ZANAFLEX) 2 MG tablet Take 2-4 mg by mouth every 8 (eight) hours as needed for muscle spasms.    [provider]      Allergies    Chlorpromazine hcl and Vicodin [hydrocodone-acetaminophen]    Review of Systems   Review of Systems  Respiratory:  Positive for shortness of breath.   Cardiovascular:  Positive for chest pain. Negative for leg swelling.  Gastrointestinal:  Negative for abdominal pain.  Musculoskeletal:  Positive for back pain (chronic).  Neurological:  Positive for weakness and headaches.    Physical Exam Updated Vital Signs BP (S) (!) 215/120   Pulse 96   Temp 98.1 F (36.7  C) (Oral)   Resp (!) 24   Ht 6' (1.829 m)   Wt 99.8 kg   SpO2 95%   BMI 29.84 kg/m  Physical Exam Vitals and nursing note reviewed.  Constitutional:      Appearance: He is well-developed. He is not diaphoretic.  HENT:     Head: Normocephalic and atraumatic.  Cardiovascular:     Rate and Rhythm: Normal rate and regular rhythm.     Heart sounds: Normal heart sounds.  Pulmonary:     Effort: Pulmonary effort is normal.     Breath sounds: Normal breath sounds.  Abdominal:     Palpations: Abdomen is soft.     Tenderness: There is no abdominal tenderness.  Skin:    General: Skin is warm and dry.  Neurological:     Mental Status: He is alert.     Comments: Awake, alert, clear speech.  Patient reports he is having severe weakness in all 4 extremities.  He very weakly tries to grip my hands.  However he seems to freely lift his arms up and when I lift his arms up and leg go they stay in the air.  Same with his legs that he has a hard time lifting up off the stretcher but it is symmetric.     ED Results / Procedures / Treatments   Labs (all labs ordered are listed, but only abnormal results  are displayed) Labs Reviewed  COMPREHENSIVE METABOLIC PANEL - Abnormal; Notable for the following components:      Result Value   Sodium 120 (*)    Potassium 2.9 (*)    Chloride 86 (*)    CO2 19 (*)    Glucose, Bld 192 (*)    Creatinine, Ser 0.60 (*)    Calcium 8.2 (*)    AST 42 (*)    All other components within normal limits  CBC WITH DIFFERENTIAL/PLATELET - Abnormal; Notable for the following components:   WBC 17.3 (*)    Neutro Abs 13.6 (*)    Abs Immature Granulocytes 0.10 (*)    All other components within normal limits  BASIC METABOLIC PANEL - Abnormal; Notable for the following components:   Sodium 120 (*)    Potassium 2.8 (*)    Chloride 88 (*)    CO2 18 (*)    Glucose, Bld 170 (*)    BUN 5 (*)    Creatinine, Ser 0.47 (*)    Calcium 7.8 (*)    All other components within  normal limits  BASIC METABOLIC PANEL - Abnormal; Notable for the following components:   Sodium 119 (*)    Potassium 2.8 (*)    Chloride 86 (*)    CO2 19 (*)    Glucose, Bld 194 (*)    BUN 5 (*)    Creatinine, Ser 0.53 (*)    Calcium 8.1 (*)    All other components within normal limits  I-STAT CHEM 8, ED - Abnormal; Notable for the following components:   Sodium 123 (*)    Potassium 3.0 (*)    Chloride 88 (*)    BUN 3 (*)    Creatinine, Ser 0.40 (*)    Glucose, Bld 170 (*)    Calcium, Ion 0.91 (*)    TCO2 18 (*)    All other components within normal limits  MRSA NEXT GEN BY PCR, NASAL  SODIUM, URINE, RANDOM  OSMOLALITY  OSMOLALITY, URINE  TROPONIN I (HIGH SENSITIVITY)  TROPONIN I (HIGH SENSITIVITY)    EKG EKG Interpretation Date/Time:  Monday May 21 2023 18:17:44 EDT Ventricular Rate:  107 PR Interval:    QRS Duration:  114 QT Interval:  395 QTC Calculation: 527 R Axis:   -6  Text Interpretation: Sinus tachycardia Borderline intraventricular conduction delay Borderline T abnormalities, inferior leads Prolonged QT interval Artifact in lead(s) I II III aVR aVL Confirmed by Pricilla Loveless [phone]) on 05/21/2023 7:46:04 PM  Radiology CT Angio Chest/Abd/Pel for Dissection W and/or Wo Contrast  Result Date: 05/21/2023 CLINICAL DATA:  Chest pressure and headache. EXAM: CT ANGIOGRAPHY CHEST, ABDOMEN AND PELVIS TECHNIQUE: Non-contrast CT of the chest was initially obtained. Multidetector CT imaging through the chest, abdomen and pelvis was performed using the standard protocol during bolus administration of intravenous contrast. Multiplanar reconstructed images and MIPs were obtained and reviewed to evaluate the vascular anatomy. RADIATION DOSE REDUCTION: This exam was performed according to the departmental dose-optimization program which includes automated exposure control, adjustment of the mA and/or kV according to patient size and/or use of iterative reconstruction  technique. CONTRAST:  OMNIPAQUE IOHEXOL 350 MG/ML SOLN COMPARISON:  September 03, 2013 FINDINGS: CTA CHEST FINDINGS Cardiovascular: There is mild calcification of the aortic arch, without evidence of aortic aneurysm or dissection. Satisfactory opacification of the pulmonary arteries to the segmental level. No evidence of pulmonary embolism. Normal heart size. No pericardial effusion. Mediastinum/Nodes: No enlarged mediastinal, hilar, or axillary lymph nodes.  Thyroid gland, trachea, and esophagus demonstrate no significant findings. Lungs/Pleura: Mild lingular, right upper lobe and bibasilar linear scarring and/or atelectasis is seen. There is no evidence of acute infiltrate, pleural effusion or pneumothorax. Musculoskeletal: No chest wall abnormality. No acute or significant osseous findings. Review of the MIP images confirms the above findings. CTA ABDOMEN AND PELVIS FINDINGS VASCULAR Aorta: Normal caliber aorta without aneurysm, dissection, vasculitis or significant stenosis. Celiac: Patent without evidence of aneurysm, dissection, vasculitis or significant stenosis. SMA: Patent without evidence of aneurysm, dissection, vasculitis or significant stenosis. Renals: Both renal arteries are patent without evidence of aneurysm, dissection, vasculitis, fibromuscular dysplasia or significant stenosis. IMA: Patent without evidence of aneurysm, dissection, vasculitis or significant stenosis. Inflow: Patent without evidence of aneurysm, dissection, vasculitis or significant stenosis. Veins: No obvious venous abnormality within the limitations of this arterial phase study. Review of the MIP images confirms the above findings. NON-VASCULAR Hepatobiliary: There is diffuse fatty infiltration of the liver parenchyma. No focal liver abnormality is seen. No gallstones, gallbladder wall thickening, or biliary dilatation. Pancreas: Unremarkable. No pancreatic ductal dilatation or surrounding inflammatory changes. Spleen: Normal  in size without focal abnormality. Adrenals/Urinary Tract: Adrenal glands are unremarkable. Kidneys are normal in size. A 1.5 cm diameter simple cyst is seen within the anterior aspect of the mid left kidney. A 2 mm nonobstructing renal calculus is seen within the left kidney. There is mild, bilateral hydronephrosis and hydroureter, left greater than right, without evidence of obstructing renal calculi. Urinary bladder is markedly distended. A mild amount of adjacent anterior inflammatory fat stranding is seen. Stomach/Bowel: Stomach is within normal limits. Appendix appears normal. No evidence of bowel wall thickening, distention, or inflammatory changes. Noninflamed diverticula are seen throughout the sigmoid colon. Lymphatic: No abnormal abdominal or pelvic lymph nodes are identified. Reproductive: Prostate gland is mildly enlarged. Other: No abdominal wall hernia or abnormality. No abdominopelvic ascites. Musculoskeletal: No acute or significant osseous findings. Review of the MIP images confirms the above findings. IMPRESSION: 1. No evidence of aortic aneurysm or dissection. 2. Mild lingular, right upper lobe and bibasilar linear scarring and/or atelectasis. 3. Hepatic steatosis. 4. 2 mm nonobstructing left renal calculus. 5. Sigmoid diverticulosis. 6. Mildly enlarged prostate gland. 7. Aortic atherosclerosis. Aortic Atherosclerosis (ICD10-I70.0). Electronically Signed   By: Aram Candela M.D.   On: 05/21/2023 20:39   CT Head Wo Contrast  Result Date: 05/21/2023 CLINICAL DATA:  Chest pressure and headache. EXAM: CT HEAD WITHOUT CONTRAST TECHNIQUE: Contiguous axial images were obtained from the base of the skull through the vertex without intravenous contrast. RADIATION DOSE REDUCTION: This exam was performed according to the departmental dose-optimization program which includes automated exposure control, adjustment of the mA and/or kV according to patient size and/or use of iterative reconstruction  technique. COMPARISON:  None Available. FINDINGS: Brain: No evidence of acute infarction, hemorrhage, hydrocephalus, extra-axial collection or mass lesion/mass effect. Vascular: No hyperdense vessel or unexpected calcification. Skull: Chronic bilateral nasal bone fractures are seen. Sinuses/Orbits: No acute finding. Other: None. IMPRESSION: 1. No acute intracranial abnormality. Electronically Signed   By: Aram Candela M.D.   On: 05/21/2023 20:26   DG Chest Portable 1 View  Result Date: 05/21/2023 CLINICAL DATA:  Chest pressure and headache. EXAM: PORTABLE CHEST 1 VIEW COMPARISON:  December 09, 2011 FINDINGS: The cardiac silhouette is mildly enlarged. Low lung volumes are noted with subsequent crowding of the bronchovascular lung markings. There is no evidence of an acute infiltrate, pleural effusion or pneumothorax. The visualized skeletal structures are unremarkable. IMPRESSION:  Low lung volumes without acute or active cardiopulmonary disease. Electronically Signed   By: Aram Candela M.D.   On: 05/21/2023 20:24    Procedures .Critical Care  Performed by: Pricilla Loveless, MD Authorized by: Pricilla Loveless, MD   Critical care provider statement:    Critical care time (minutes):  45   Critical care time was exclusive of:  Separately billable procedures and treating other patients   Critical care was necessary to treat or prevent imminent or life-threatening deterioration of the following conditions:  Cardiac failure, CNS failure or compromise and metabolic crisis   Critical care was time spent personally by me on the following activities:  Development of treatment plan with patient or surrogate, discussions with consultants, evaluation of patient's response to treatment, examination of patient, ordering and review of laboratory studies, ordering and review of radiographic studies, ordering and performing treatments and interventions, pulse oximetry, re-evaluation of patient's condition and review  of old charts     Medications Ordered in ED Medications  Chlorhexidine Gluconate Cloth 2 % PADS 6 each (has no administration in time range)  0.9 %  sodium chloride infusion ( Intravenous New Bag/Given 05/21/23 2241)  nicardipine (CARDENE) 20mg  in 0.86% saline IV infusion (0.1 mg/ml) (has no administration in time range)  sodium chloride 0.9 % bolus 1,000 mL (1,000 mLs Intravenous Bolus 05/21/23 1812)  aspirin chewable tablet 324 mg (324 mg Oral Given 05/21/23 1811)  HYDROmorphone (DILAUDID) injection 1 mg (1 mg Intravenous Given 05/21/23 1812)  iohexol (OMNIPAQUE) 350 MG/ML injection 100 mL (100 mLs Intravenous Contrast Given 05/21/23 1945)  diazepam (VALIUM) injection 5 mg (5 mg Intravenous Given 05/21/23 2033)  potassium chloride SA (KLOR-CON M) CR tablet 40 mEq (40 mEq Oral Given 05/21/23 2245)  potassium chloride 10 mEq in 100 mL IVPB (10 mEq Intravenous New Bag/Given 05/21/23 2241)  labetalol (NORMODYNE) injection 10 mg (10 mg Intravenous Given 05/21/23 2243)    ED Course/ Medical Decision Making/ A&P                                 Medical Decision Making Amount and/or Complexity of Data Reviewed Labs: ordered.    Details: Hyponatremia and hypokalemia.  Normal troponin. Radiology: ordered and independent interpretation performed.    Details: No head bleed or aortic dissection ECG/medicine tests: ordered and independent interpretation performed.    Details: Nonspecific, hard to interpret due to the artifact.  Risk OTC drugs. Prescription drug management. Decision regarding hospitalization.   Patient presents with multiple symptoms.  He is a little shaky and diffusely weak but no focal weakness.  He is found to surprisingly have a sodium of 120.  After this, wife notes that he has been chugging water throughout the day ever since he started feeling bad.  Unclear if this was the cause, related to a previous existing hyponatremia, or some mixed.  He is not altered.  He did later  develop urinary retention and became a little more agitated when this was going on but relaxed after a catheter was placed.  1 L of urine was removed.  There is no evidence of MI, aortic dissection or acute stroke/head bleed.  He is relatively ill though not altered or seizing or in a coma.  He has begun to have progressive hypertension.  I think he will need admission into a closely monitored setting.  Discussed with Dr. Gaynell Face of ICU but at this point she would  hold off on 3% saline and thinks he is stable from medical service in the stepdown/ICU unit, either here or at [facility].  Discussed with Dr. Thomes Dinning, he will admit.  He requests IV labetalol to help with blood pressure.  Of note, he had been given a saline bolus while waiting on workup.  Will hold off on further boluses as we workup his hyponatremia.  The nurse did hang a second liter (with no order) unfortunately which was already given prior to me realizing this. Will re-check his sodium.        Final Clinical Impression(s) / ED Diagnoses Final diagnoses:  Hyponatremia    Rx / DC Orders ED Discharge Orders     None         Pricilla Loveless, MD 05/22/23 0000

## 2023-05-22 DIAGNOSIS — R0789 Other chest pain: Secondary | ICD-10-CM | POA: Insufficient documentation

## 2023-05-22 DIAGNOSIS — D72829 Elevated white blood cell count, unspecified: Secondary | ICD-10-CM | POA: Insufficient documentation

## 2023-05-22 DIAGNOSIS — E876 Hypokalemia: Secondary | ICD-10-CM | POA: Insufficient documentation

## 2023-05-22 DIAGNOSIS — Z720 Tobacco use: Secondary | ICD-10-CM | POA: Insufficient documentation

## 2023-05-22 DIAGNOSIS — G8929 Other chronic pain: Secondary | ICD-10-CM | POA: Insufficient documentation

## 2023-05-22 DIAGNOSIS — R739 Hyperglycemia, unspecified: Secondary | ICD-10-CM | POA: Insufficient documentation

## 2023-05-22 DIAGNOSIS — I160 Hypertensive urgency: Secondary | ICD-10-CM | POA: Diagnosis not present

## 2023-05-22 DIAGNOSIS — E871 Hypo-osmolality and hyponatremia: Secondary | ICD-10-CM | POA: Diagnosis not present

## 2023-05-22 LAB — URINALYSIS, W/ REFLEX TO CULTURE (INFECTION SUSPECTED)
Bacteria, UA: NONE SEEN
Bilirubin Urine: NEGATIVE
Glucose, UA: NEGATIVE mg/dL
Ketones, ur: NEGATIVE mg/dL
Leukocytes,Ua: NEGATIVE
Nitrite: NEGATIVE
Protein, ur: NEGATIVE mg/dL
Specific Gravity, Urine: 1.002 — ABNORMAL LOW (ref 1.005–1.030)
pH: 6 (ref 5.0–8.0)

## 2023-05-22 LAB — COMPREHENSIVE METABOLIC PANEL
ALT: 31 U/L (ref 0–44)
AST: 37 U/L (ref 15–41)
Albumin: 4 g/dL (ref 3.5–5.0)
Alkaline Phosphatase: 47 U/L (ref 38–126)
Anion gap: 11 (ref 5–15)
BUN: 6 mg/dL (ref 6–20)
CO2: 24 mmol/L (ref 22–32)
Calcium: 8.4 mg/dL — ABNORMAL LOW (ref 8.9–10.3)
Chloride: 91 mmol/L — ABNORMAL LOW (ref 98–111)
Creatinine, Ser: 0.57 mg/dL — ABNORMAL LOW (ref 0.61–1.24)
GFR, Estimated: >60 mL/min (ref >60)
Glucose, Bld: 162 mg/dL — ABNORMAL HIGH (ref 70–99)
Potassium: 3.2 mmol/L — ABNORMAL LOW (ref 3.5–5.1)
Sodium: 126 mmol/L — ABNORMAL LOW (ref 135–145)
Total Bilirubin: 1.2 mg/dL (ref 0.3–1.2)
Total Protein: 7.4 g/dL (ref 6.5–8.1)

## 2023-05-22 LAB — HIV ANTIBODY (ROUTINE TESTING W REFLEX): HIV Screen 4th Generation wRfx: NONREACTIVE

## 2023-05-22 LAB — CBC
HCT: 43.1 % (ref 39.0–52.0)
Hemoglobin: 15.6 g/dL (ref 13.0–17.0)
MCH: 30.5 pg (ref 26.0–34.0)
MCHC: 36.2 g/dL — ABNORMAL HIGH (ref 30.0–36.0)
MCV: 84.2 fL (ref 80.0–100.0)
Platelets: 280 10*3/uL (ref 150–400)
RBC: 5.12 MIL/uL (ref 4.22–5.81)
RDW: 11.8 % (ref 11.5–15.5)
WBC: 16 10*3/uL — ABNORMAL HIGH (ref 4.0–10.5)
nRBC: 0 % (ref 0.0–0.2)

## 2023-05-22 LAB — RAPID URINE DRUG SCREEN, HOSP PERFORMED
Amphetamines: NOT DETECTED
Barbiturates: POSITIVE — AB
Benzodiazepines: POSITIVE — AB
Cocaine: NOT DETECTED
Opiates: POSITIVE — AB
Tetrahydrocannabinol: NOT DETECTED

## 2023-05-22 LAB — VITAMIN B12: Vitamin B-12: 323 pg/mL (ref 180–914)

## 2023-05-22 LAB — FOLATE: Folate: 5.5 ng/mL — ABNORMAL LOW (ref >5.9)

## 2023-05-22 LAB — BASIC METABOLIC PANEL
Anion gap: 11 (ref 5–15)
BUN: 8 mg/dL (ref 6–20)
CO2: 25 mmol/L (ref 22–32)
Calcium: 8.5 mg/dL — ABNORMAL LOW (ref 8.9–10.3)
Chloride: 96 mmol/L — ABNORMAL LOW (ref 98–111)
Creatinine, Ser: 0.68 mg/dL (ref 0.61–1.24)
GFR, Estimated: >60 mL/min (ref >60)
Glucose, Bld: 112 mg/dL — ABNORMAL HIGH (ref 70–99)
Potassium: 3.9 mmol/L (ref 3.5–5.1)
Sodium: 132 mmol/L — ABNORMAL LOW (ref 135–145)

## 2023-05-22 LAB — GLUCOSE, CAPILLARY
Glucose-Capillary: 154 mg/dL — ABNORMAL HIGH (ref 70–99)
Glucose-Capillary: 168 mg/dL — ABNORMAL HIGH (ref 70–99)

## 2023-05-22 LAB — PROCALCITONIN: Procalcitonin: 0.18 ng/mL

## 2023-05-22 LAB — TSH: TSH: 0.458 u[IU]/mL (ref 0.350–4.500)

## 2023-05-22 LAB — MAGNESIUM: Magnesium: 1.3 mg/dL — ABNORMAL LOW (ref 1.7–2.4)

## 2023-05-22 LAB — MRSA NEXT GEN BY PCR, NASAL: MRSA by PCR Next Gen: NOT DETECTED

## 2023-05-22 LAB — PHOSPHORUS: Phosphorus: 3.7 mg/dL (ref 2.5–4.6)

## 2023-05-22 LAB — LACTIC ACID, PLASMA: Lactic Acid, Venous: 1.7 mmol/L (ref 0.5–1.9)

## 2023-05-22 LAB — OSMOLALITY, URINE: Osmolality, Ur: 208 mOsm/kg — ABNORMAL LOW (ref 300–900)

## 2023-05-22 LAB — OSMOLALITY: Osmolality: 267 mOsm/kg — ABNORMAL LOW (ref 275–295)

## 2023-05-22 LAB — T4, FREE: Free T4: 0.81 ng/dL (ref 0.61–1.12)

## 2023-05-22 LAB — CK: Total CK: 383 U/L (ref 49–397)

## 2023-05-22 MED ORDER — ONDANSETRON HCL 4 MG/2ML IJ SOLN
4.0000 mg | Freq: Four times a day (QID) | INTRAMUSCULAR | Status: DC | PRN
  Administered 2023-05-22: 4 mg via INTRAVENOUS
  Filled 2023-05-22: qty 2

## 2023-05-22 MED ORDER — INSULIN ASPART 100 UNIT/ML IJ SOLN
0.0000 [IU] | Freq: Three times a day (TID) | INTRAMUSCULAR | Status: DC
  Administered 2023-05-22: 2 [IU] via SUBCUTANEOUS

## 2023-05-22 MED ORDER — MORPHINE SULFATE (PF) 2 MG/ML IV SOLN
2.0000 mg | INTRAVENOUS | Status: DC | PRN
  Administered 2023-05-22 – 2023-05-23 (×5): 2 mg via INTRAVENOUS
  Filled 2023-05-22 (×5): qty 1

## 2023-05-22 MED ORDER — ACETAMINOPHEN 325 MG PO TABS
650.0000 mg | ORAL_TABLET | Freq: Four times a day (QID) | ORAL | Status: DC | PRN
  Administered 2023-05-22: 650 mg via ORAL
  Filled 2023-05-22: qty 2

## 2023-05-22 MED ORDER — ENOXAPARIN SODIUM 40 MG/0.4ML IJ SOSY
40.0000 mg | PREFILLED_SYRINGE | INTRAMUSCULAR | Status: DC
  Administered 2023-05-22 – 2023-05-23 (×2): 40 mg via SUBCUTANEOUS
  Filled 2023-05-22 (×2): qty 0.4

## 2023-05-22 MED ORDER — MAGNESIUM SULFATE 2 GM/50ML IV SOLN
2.0000 g | Freq: Once | INTRAVENOUS | Status: AC
  Administered 2023-05-22: 2 g via INTRAVENOUS
  Filled 2023-05-22: qty 50

## 2023-05-22 MED ORDER — METOPROLOL TARTRATE 50 MG PO TABS
50.0000 mg | ORAL_TABLET | Freq: Two times a day (BID) | ORAL | Status: DC
  Administered 2023-05-22 – 2023-05-23 (×3): 50 mg via ORAL
  Filled 2023-05-22 (×3): qty 1

## 2023-05-22 MED ORDER — PANTOPRAZOLE SODIUM 40 MG PO TBEC
40.0000 mg | DELAYED_RELEASE_TABLET | Freq: Every day | ORAL | Status: DC
  Administered 2023-05-22 – 2023-05-23 (×2): 40 mg via ORAL
  Filled 2023-05-22 (×2): qty 1

## 2023-05-22 MED ORDER — ALPRAZOLAM 0.5 MG PO TABS
1.0000 mg | ORAL_TABLET | Freq: Two times a day (BID) | ORAL | Status: DC
  Administered 2023-05-22 – 2023-05-23 (×4): 1 mg via ORAL
  Filled 2023-05-22 (×4): qty 2

## 2023-05-22 NOTE — TOC CM/SW Note (Signed)
Transition of Care [facility]) - Inpatient Brief Assessment   Patient Details  Name: Eric [name] MRN: [id number] Date of Birth: 1971-02-07  Transition of Care [facility] CM/SW Contact:    Villa Herb, LCSWA Phone Number: 05/22/2023, 11:53 AM   Clinical Narrative: Transition of Care Department [facility]) has reviewed patient and no TOC needs have been identified at this time. We will continue to monitor patient advancement through interdisciplinary progression rounds. If new patient transition needs arise, please place a TOC consult.  Transition of Care Asessment: Insurance and Status: Insurance coverage has been reviewed Patient has primary care physician: Yes Home environment has been reviewed: from home Prior level of function:: independent Prior/Current Home Services: No current home services Social Determinants of Health Reivew: SDOH reviewed no interventions necessary Readmission risk has been reviewed: Yes Transition of care needs: no transition of care needs at this time

## 2023-05-22 NOTE — Progress Notes (Addendum)
PROGRESS NOTE  Eric [name] VWU:[id number] DOB: 01/11/1971 DOA: 05/21/2023 PCP: Benita Stabile, MD  Brief History:  52 year old male with a history of hypertension, anxiety, chronic back pain with opioid dependence, hyperlipidemia presenting with 1 day history of chest discomfort and shortness of breath.  The patient woke up on the morning of 05/21/2023 with the above symptoms.  He felt like he was having a hot flash.  He was having some diaphoresis.  He also complained of some dizziness.  He denied any headache, visual disturbance, loss of vision, focal extremity weakness, or syncope.  There is no dysarthria or dysesthesias.  For some reason, the patient felt like he was getting dehydrated because of his symptoms.  As result, he drank twenty x 16 oz bottles of water in one sitting.  He continued to feel some lightheadedness and chest discomfort..  As result, he presented for further evaluation and treatment. The patient continues to smoke 1/2 pack/day.  He denies any illicit drugs.  He used to drink at least 3 nights per week but has not had any alcohol in 3 years.  He denies any illicit drug use.  He denies any dysuria, hematuria, hematochezia, melena. In the ED, the patient was afebrile and hemodynamically stable with oxygen saturation 94% room air.  In fact, the patient was hypertensive up to 225/123.  He was started on a Cardene drip.  BMP showed sodium 120, potassium 2.9, bicarbonate 19, serum creatinine 0.60.  AST 42, ALT 38, alk phosphatase 48, total bilirubin 1.0. Troponin 7>>10.  Urine sodium 33.  WBC 17.3, hemoglobin 15.3, platelets 265,000.  CTA chest was negative for PE, aortic aneurysm, or dissection.  There was mild lingular and bibasilar scarring.  CT abdomen and pelvis negative for hemodynamically significant stenosis.  There is prostamegaly and sigmoid diverticulosis.  There is mild hydronephrosis bilateral with markedly distended urine bladder.     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 -Serum osmolarity--267 -Urine osmolarity--208 -Urine sodium 33 -limit free water intake  Atypical chest discomfort -Echocardiogram -Troponin 7>> 10 -Personally reviewed EKG--sinus rhythm, no concerning ST-T wave change  Hypertensive urgency -Wean off Cardene drip -Restart metoprolol -Urine drug screen  Chronic back pain/opioid dependence -PDMP reviewed -Percocet 10/325, #120, last refill 04/25/2023 -Xanax 1 mg, #60, last refill 04/17/2023  Anxiety -Xanax as discussed above  Leukemoid reaction -Lactic acid -PCT -UA -CT chest--no consolidation -CT abd--no acute findings  Generalized weakness -B12 -Folic acid -TSH -UA -CK -in part due to hypomagnesemia  Diabetes mellitus type 2, controlled -Hold glipizide -Hemoglobin A1c -NovoLog sliding scale  Hypomagnesemia -replete  Urinary Retention -foley placed in ED -plan to d/c foley 9/25 AM for voiding trial        Subjective: Patient denies fevers, chills, headache, chest pain, dyspnea, nausea, vomiting, diarrhea, abdominal pain, dysuria, hematuria, hematochezia, and melena.   Objective: Vitals:   05/22/23 1100 05/22/23 1143 05/22/23 1200 05/22/23 1312  BP: (!) 132/100  129/69 (!) 172/86  Pulse: 95  82 (!) 109  Resp: 16  16 (!) 24  Temp:  98.1 F (36.7 C)    TempSrc:  Oral    SpO2: 91%  93% 93%  Weight:      Height:        Intake/Output Summary (Last 24 hours) at 05/22/2023 1419 Last data filed at 05/22/2023 1347 Gross per 24 hour  Intake 950.76 ml  Output 8125 ml  Net -7174.24 ml   Weight change:  Exam:  General:  Pt is alert, follows commands appropriately, not in acute distress HEENT: No icterus, No thrush, No neck mass, [place]/AT Cardiovascular: RRR, S1/S2, no rubs, no gallops Respiratory: CTA bilaterally, no wheezing, no crackles, no rhonchi Abdomen: Soft/+BS, non tender, non distended, no guarding Extremities: No edema,  No lymphangitis, No petechiae, No rashes, no synovitis Neuro:  CN II-XII intact, strength 4/5 in RUE, RLE, strength 4/5 LUE, LLE; sensation intact bilateral; no dysmetria; babinski equivocal    Data Reviewed: I have personally reviewed following labs and imaging studies Basic Metabolic Panel: Recent Labs  Lab 05/21/23 1815 05/21/23 2032 05/21/23 2042 05/21/23 2246 05/22/23 0447 05/22/23 1135  NA 120* 120* 123* 119* 126* 132*  K 2.9* 2.8* 3.0* 2.8* 3.2* 3.9  CL 86* 88* 88* 86* 91* 96*  CO2 19* 18*  --  19* 24 25  GLUCOSE 192* 170* 170* 194* 162* 112*  BUN 6 5* 3* 5* 6 8  CREATININE 0.60* 0.47* 0.40* 0.53* 0.57* 0.68  CALCIUM 8.2* 7.8*  --  8.1* 8.4* 8.5*  MG  --   --   --   --  1.3*  --   PHOS  --   --   --   --  3.7  --    Liver Function Tests: Recent Labs  Lab 05/21/23 1815 05/22/23 0447  AST 42* 37  ALT 37 31  ALKPHOS 48 47  BILITOT 1.0 1.2  PROT 7.4 7.4  ALBUMIN 4.2 4.0   No results for input(s): "LIPASE", "AMYLASE" in the last 168 hours. No results for input(s): "AMMONIA" in the last 168 hours. Coagulation Profile: No results for input(s): "INR", "PROTIME" in the last 168 hours. CBC: Recent Labs  Lab 05/21/23 1815 05/21/23 2042 05/22/23 0447  WBC 17.3*  --  16.0*  NEUTROABS 13.6*  --   --   HGB 15.3 16.0 15.6  HCT 43.0 47.0 43.1  MCV 84.0  --  84.2  PLT 265  --  280   Cardiac Enzymes: No results for input(s): "CKTOTAL", "CKMB", "CKMBINDEX", "TROPONINI" in the last 168 hours. BNP: Invalid input(s): "POCBNP" CBG: No results for input(s): "GLUCAP" in the last 168 hours. HbA1C: No results for input(s): "HGBA1C" in the last 72 hours. Urine analysis:    Component Value Date/Time   COLORURINE YELLOW 09/03/2013 1143   APPEARANCEUR Clear 01/24/2023 1557   LABSPEC >1.030 (H) 09/03/2013 1143   PHURINE 5.5 09/03/2013 1143   GLUCOSEU Negative 01/24/2023 1557   HGBUR NEGATIVE 09/03/2013 1143   BILIRUBINUR Negative 01/24/2023 1557   KETONESUR NEGATIVE  09/03/2013 1143   PROTEINUR Negative 01/24/2023 1557   PROTEINUR NEGATIVE 09/03/2013 1143   UROBILINOGEN 0.2 09/03/2013 1143   NITRITE Negative 01/24/2023 1557   NITRITE NEGATIVE 09/03/2013 1143   LEUKOCYTESUR Negative 01/24/2023 1557   Sepsis Labs: @LABRCNTIP (procalcitonin:4,lacticidven:4) ) Recent Results (from the past 240 hour(s))  MRSA Next Gen by PCR, Nasal     Status: None   Collection Time: 05/21/23 11:31 PM   Specimen: Nasal Mucosa; Nasal Swab  Result Value Ref Range Status   MRSA by PCR Next Gen NOT DETECTED NOT DETECTED Final    Comment: (NOTE) The GeneXpert MRSA Assay (FDA approved for NASAL specimens only), is one component of a comprehensive MRSA colonization surveillance program. It is not intended to diagnose MRSA infection nor to guide or monitor treatment for MRSA infections. Test performance is not FDA approved in patients less than 42 years old. Performed at [facility], [address], [place],  [place] [address]      Scheduled Meds:  ALPRAZolam  1 mg Oral BID   Chlorhexidine Gluconate Cloth  6 each Topical Daily   enoxaparin (LOVENOX) injection  40 mg Subcutaneous Q24H   metoprolol tartrate  50 mg Oral BID   pantoprazole  40 mg Oral Daily   Continuous Infusions:  niCARDipine 5 mg/hr (05/22/23 1347)    Procedures/Studies: CT Angio Chest/Abd/Pel for Dissection W and/or Wo Contrast  Result Date: 05/21/2023 CLINICAL DATA:  Chest pressure and headache. EXAM: CT ANGIOGRAPHY CHEST, ABDOMEN AND PELVIS TECHNIQUE: Non-contrast CT of the chest was initially obtained. Multidetector CT imaging through the chest, abdomen and pelvis was performed using the standard protocol during bolus administration of intravenous contrast. Multiplanar reconstructed images and MIPs were obtained and reviewed to evaluate the vascular anatomy. RADIATION DOSE REDUCTION: This exam was performed according to the departmental dose-optimization program which includes automated exposure  control, adjustment of the mA and/or kV according to patient size and/or use of iterative reconstruction technique. CONTRAST:  OMNIPAQUE IOHEXOL 350 MG/ML SOLN COMPARISON:  September 03, 2013 FINDINGS: CTA CHEST FINDINGS Cardiovascular: There is mild calcification of the aortic arch, without evidence of aortic aneurysm or dissection. Satisfactory opacification of the pulmonary arteries to the segmental level. No evidence of pulmonary embolism. Normal heart size. No pericardial effusion. Mediastinum/Nodes: No enlarged mediastinal, hilar, or axillary lymph nodes. Thyroid gland, trachea, and esophagus demonstrate no significant findings. Lungs/Pleura: Mild lingular, right upper lobe and bibasilar linear scarring and/or atelectasis is seen. There is no evidence of acute infiltrate, pleural effusion or pneumothorax. Musculoskeletal: No chest wall abnormality. No acute or significant osseous findings. Review of the MIP images confirms the above findings. CTA ABDOMEN AND PELVIS FINDINGS VASCULAR Aorta: Normal caliber aorta without aneurysm, dissection, vasculitis or significant stenosis. Celiac: Patent without evidence of aneurysm, dissection, vasculitis or significant stenosis. SMA: Patent without evidence of aneurysm, dissection, vasculitis or significant stenosis. Renals: Both renal arteries are patent without evidence of aneurysm, dissection, vasculitis, fibromuscular dysplasia or significant stenosis. IMA: Patent without evidence of aneurysm, dissection, vasculitis or significant stenosis. Inflow: Patent without evidence of aneurysm, dissection, vasculitis or significant stenosis. Veins: No obvious venous abnormality within the limitations of this arterial phase study. Review of the MIP images confirms the above findings. NON-VASCULAR Hepatobiliary: There is diffuse fatty infiltration of the liver parenchyma. No focal liver abnormality is seen. No gallstones, gallbladder wall thickening, or biliary dilatation.  Pancreas: Unremarkable. No pancreatic ductal dilatation or surrounding inflammatory changes. Spleen: Normal in size without focal abnormality. Adrenals/Urinary Tract: Adrenal glands are unremarkable. Kidneys are normal in size. A 1.5 cm diameter simple cyst is seen within the anterior aspect of the mid left kidney. A 2 mm nonobstructing renal calculus is seen within the left kidney. There is mild, bilateral hydronephrosis and hydroureter, left greater than right, without evidence of obstructing renal calculi. Urinary bladder is markedly distended. A mild amount of adjacent anterior inflammatory fat stranding is seen. Stomach/Bowel: Stomach is within normal limits. Appendix appears normal. No evidence of bowel wall thickening, distention, or inflammatory changes. Noninflamed diverticula are seen throughout the sigmoid colon. Lymphatic: No abnormal abdominal or pelvic lymph nodes are identified. Reproductive: Prostate gland is mildly enlarged. Other: No abdominal wall hernia or abnormality. No abdominopelvic ascites. Musculoskeletal: No acute or significant osseous findings. Review of the MIP images confirms the above findings. IMPRESSION: 1. No evidence of aortic aneurysm or dissection. 2. Mild lingular, right upper lobe and bibasilar linear scarring and/or atelectasis. 3. Hepatic steatosis. 4.  2 mm nonobstructing left renal calculus. 5. Sigmoid diverticulosis. 6. Mildly enlarged prostate gland. 7. Aortic atherosclerosis. Aortic Atherosclerosis (ICD10-I70.0). Electronically Signed   By: Aram Candela M.D.   On: 05/21/2023 20:39   CT Head Wo Contrast  Result Date: 05/21/2023 CLINICAL DATA:  Chest pressure and headache. EXAM: CT HEAD WITHOUT CONTRAST TECHNIQUE: Contiguous axial images were obtained from the base of the skull through the vertex without intravenous contrast. RADIATION DOSE REDUCTION: This exam was performed according to the departmental dose-optimization program which includes automated exposure  control, adjustment of the mA and/or kV according to patient size and/or use of iterative reconstruction technique. COMPARISON:  None Available. FINDINGS: Brain: No evidence of acute infarction, hemorrhage, hydrocephalus, extra-axial collection or mass lesion/mass effect. Vascular: No hyperdense vessel or unexpected calcification. Skull: Chronic bilateral nasal bone fractures are seen. Sinuses/Orbits: No acute finding. Other: None. IMPRESSION: 1. No acute intracranial abnormality. Electronically Signed   By: Aram Candela M.D.   On: 05/21/2023 20:26   DG Chest Portable 1 View  Result Date: 05/21/2023 CLINICAL DATA:  Chest pressure and headache. EXAM: PORTABLE CHEST 1 VIEW COMPARISON:  December 09, 2011 FINDINGS: The cardiac silhouette is mildly enlarged. Low lung volumes are noted with subsequent crowding of the bronchovascular lung markings. There is no evidence of an acute infiltrate, pleural effusion or pneumothorax. The visualized skeletal structures are unremarkable. IMPRESSION: Low lung volumes without acute or active cardiopulmonary disease. Electronically Signed   By: Aram Candela M.D.   On: 05/21/2023 20:24    Catarina Hartshorn, DO  [facility]  If 7PM-7AM, please contact night-coverage [email or web address] Password [email or web address] 05/22/2023, 2:19 PM   LOS: 1 day

## 2023-05-22 NOTE — Hospital Course (Signed)
52 year old male with a history of hypertension, anxiety, chronic back pain with opioid dependence, hyperlipidemia presenting with 1 day history of chest discomfort and shortness of breath.  The patient woke up on the morning of 05/21/2023 with the above symptoms.  He felt like he was having a hot flash.  He was having some diaphoresis.  He also complained of some dizziness.  He denied any headache, visual disturbance, loss of vision, focal extremity weakness, or syncope.  There is no dysarthria or dysesthesias.  For some reason, the patient felt like he was getting dehydrated because of his symptoms.  As result, he drank twenty x 16 oz bottles of water in one sitting.  He continued to feel some lightheadedness and chest discomfort..  As result, he presented for further evaluation and treatment. The patient continues to smoke 1/2 pack/day.  He denies any illicit drugs.  He used to drink at least 3 nights per week but has not had any alcohol in 3 years.  He denies any illicit drug use.  He denies any dysuria, hematuria, hematochezia, melena. In the ED, the patient was afebrile and hemodynamically stable with oxygen saturation 94% room air.  In fact, the patient was hypertensive up to 225/123.  He was started on a Cardene drip.  BMP showed sodium 120, potassium 2.9, bicarbonate 19, serum creatinine 0.60.  AST 42, ALT 38, alk phosphatase 48, total bilirubin 1.0. Troponin 7>>10.  Urine sodium 33.  WBC 17.3, hemoglobin 15.3, platelets 265,000.  CTA chest was negative for PE, aortic aneurysm, or dissection.  There was mild lingular and bibasilar scarring.  CT abdomen and pelvis negative for hemodynamically significant stenosis.  There is prostamegaly and sigmoid diverticulosis.  There is mild hydronephrosis bilateral with markedly distended urine bladder.

## 2023-05-22 NOTE — Plan of Care (Signed)
  Problem: Education: Goal: Knowledge of General Education information will improve Description: Including pain rating scale, medication(s)/side effects and non-pharmacologic comfort measures Outcome: Progressing   Problem: Health Behavior/Discharge Planning: Goal: Ability to manage health-related needs will improve Outcome: Progressing   Problem: Clinical Measurements: Goal: Ability to maintain clinical measurements within normal limits will improve Outcome: Progressing Goal: Will remain free from infection Outcome: Progressing Goal: Diagnostic test results will improve Outcome: Progressing Goal: Respiratory complications will improve Outcome: Progressing Goal: Cardiovascular complication will be avoided Outcome: Progressing   Problem: Activity: Goal: Risk for activity intolerance will decrease Outcome: Progressing   Problem: Nutrition: Goal: Adequate nutrition will be maintained Outcome: Progressing   Problem: Coping: Goal: Level of anxiety will decrease Outcome: Progressing   Problem: Elimination: Goal: Will not experience complications related to bowel motility Outcome: Progressing Goal: Will not experience complications related to urinary retention Outcome: Progressing   Problem: Pain Managment: Goal: General experience of comfort will improve Outcome: Progressing   Problem: Safety: Goal: Ability to remain free from injury will improve Outcome: Progressing   Problem: Skin Integrity: Goal: Risk for impaired skin integrity will decrease Outcome: Progressing   Problem: Education: Goal: Ability to describe self-care measures that may prevent or decrease complications (Diabetes Survival Skills Education) will improve Outcome: Progressing Goal: Individualized Educational Video(s) Outcome: Progressing   Problem: Coping: Goal: Ability to adjust to condition or change in health will improve Outcome: Progressing   Problem: Fluid Volume: Goal: Ability to  maintain a balanced intake and output will improve Outcome: Progressing   Problem: Health Behavior/Discharge Planning: Goal: Ability to identify and utilize available resources and services will improve Outcome: Progressing Goal: Ability to manage health-related needs will improve Outcome: Progressing

## 2023-05-23 DIAGNOSIS — E871 Hypo-osmolality and hyponatremia: Secondary | ICD-10-CM | POA: Diagnosis not present

## 2023-05-23 LAB — BASIC METABOLIC PANEL
Anion gap: 13 (ref 5–15)
BUN: 11 mg/dL (ref 6–20)
CO2: 24 mmol/L (ref 22–32)
Calcium: 8.6 mg/dL — ABNORMAL LOW (ref 8.9–10.3)
Chloride: 100 mmol/L (ref 98–111)
Creatinine, Ser: 0.71 mg/dL (ref 0.61–1.24)
GFR, Estimated: >60 mL/min (ref >60)
Glucose, Bld: 104 mg/dL — ABNORMAL HIGH (ref 70–99)
Potassium: 3.4 mmol/L — ABNORMAL LOW (ref 3.5–5.1)
Sodium: 137 mmol/L (ref 135–145)

## 2023-05-23 LAB — MAGNESIUM: Magnesium: 2.1 mg/dL (ref 1.7–2.4)

## 2023-05-23 LAB — HEMOGLOBIN A1C
Hgb A1c MFr Bld: 6.8 % — ABNORMAL HIGH (ref 4.8–5.6)
Mean Plasma Glucose: 148 mg/dL

## 2023-05-23 LAB — GLUCOSE, CAPILLARY: Glucose-Capillary: 102 mg/dL — ABNORMAL HIGH (ref 70–99)

## 2023-05-23 MED ORDER — FOLIC ACID 1 MG PO TABS: 1.0000 mg | ORAL_TABLET | Freq: Every day | ORAL | 3 refills | Status: AC

## 2023-05-23 MED ORDER — AMLODIPINE BESYLATE 5 MG PO TABS
10.0000 mg | ORAL_TABLET | Freq: Every day | ORAL | Status: DC
  Administered 2023-05-23: 10 mg via ORAL
  Filled 2023-05-23: qty 2

## 2023-05-23 MED ORDER — OXYCODONE HCL 5 MG PO TABS
5.0000 mg | ORAL_TABLET | Freq: Four times a day (QID) | ORAL | Status: DC | PRN
  Administered 2023-05-23 (×2): 5 mg via ORAL
  Filled 2023-05-23 (×3): qty 1

## 2023-05-23 MED ORDER — CLONIDINE HCL 0.1 MG PO TABS: 0.1000 mg | ORAL_TABLET | Freq: Three times a day (TID) | ORAL | 1 refills | Status: DC

## 2023-05-23 MED ORDER — LABETALOL HCL 5 MG/ML IV SOLN
5.0000 mg | Freq: Four times a day (QID) | INTRAVENOUS | Status: DC | PRN
  Administered 2023-05-23: 5 mg via INTRAVENOUS
  Filled 2023-05-23: qty 4

## 2023-05-23 MED ORDER — BUTALBITAL-APAP-CAFFEINE 50-325-40 MG PO TABS
2.0000 | ORAL_TABLET | Freq: Once | ORAL | Status: AC
  Administered 2023-05-23: 2 via ORAL
  Filled 2023-05-23: qty 2

## 2023-05-23 MED ORDER — CLONIDINE HCL 0.1 MG PO TABS
0.1000 mg | ORAL_TABLET | Freq: Three times a day (TID) | ORAL | Status: DC
  Administered 2023-05-23: 0.1 mg via ORAL
  Filled 2023-05-23: qty 1

## 2023-05-23 MED ORDER — OXYCODONE-ACETAMINOPHEN 5-325 MG PO TABS
1.0000 | ORAL_TABLET | Freq: Four times a day (QID) | ORAL | Status: DC | PRN
  Administered 2023-05-23 (×2): 1 via ORAL
  Filled 2023-05-23 (×3): qty 1

## 2023-05-23 NOTE — Progress Notes (Signed)
Discharge paperwork reviewed with patient. VSS. IV removed. Pt verbalized understanding of DC instructions. Now awaiting ride.

## 2023-05-23 NOTE — Discharge Summary (Signed)
Physician Discharge Summary   Patient: Eric [name] MRN: [id number] DOB: Jan 01, 1971  Admit date:     05/21/2023  Discharge date: 05/23/23  Discharge Physician: Tyrone Nine   PCP: Benita Stabile, MD   Recommendations at discharge:  Follow up with PCP in 1 week with repeat BMP and blood pressure check. Sodium normalized with supportive care alone after polydipsia-induced acute hyponatremia. Blood pressure elevated, clonidine prescribed as an adjunctive to assist with anxiety as well.  Suggest checking postvoid bladder residual volume and referral to urology if elevated. Passed voiding trial on day of discharge but did have foley for urinary retention.   Discharge Diagnoses: Principal Problem:   Hyponatremia Active Problems:   Anxiety   GERD   Hypokalemia   Hypertensive urgency   Chronic back pain   Hyperglycemia   Leukocytosis   Atypical chest pain   Tobacco abuse  Hospital Course: 52 year old male with a history of hypertension, anxiety, chronic back pain with opioid dependence, hyperlipidemia presenting with 1 day history of chest discomfort and shortness of breath.  The patient woke up on the morning of 05/21/2023 with the above symptoms.  He felt like he was having a hot flash.  He was having some diaphoresis.  He also complained of some dizziness.  He denied any headache, visual disturbance, loss of vision, focal extremity weakness, or syncope.  There is no dysarthria or dysesthesias.  For some reason, the patient felt like he was getting dehydrated because of his symptoms.  As result, he drank twenty x 16 oz bottles of water in one sitting.  He continued to feel some lightheadedness and chest discomfort..  As result, he presented for further evaluation and treatment. The patient continues to smoke 1/2 pack/day.  He denies any illicit drugs.  He used to drink at least 3 nights per week but has not had any alcohol in 3 years.  He denies any illicit drug use.  He denies any dysuria,  hematuria, hematochezia, melena. In the ED, the patient was afebrile and hemodynamically stable with oxygen saturation 94% room air.  In fact, the patient was hypertensive up to 225/123.  He was started on a Cardene drip.  BMP showed sodium 120, potassium 2.9, bicarbonate 19, serum creatinine 0.60.  AST 42, ALT 38, alk phosphatase 48, total bilirubin 1.0. Troponin 7>>10.  Urine sodium 33.  WBC 17.3, hemoglobin 15.3, platelets 265,000.  CTA chest was negative for PE, aortic aneurysm, or dissection.  There was mild lingular and bibasilar scarring.  CT abdomen and pelvis negative for hemodynamically significant stenosis.  There is prostamegaly and sigmoid diverticulosis.  There is mild hydronephrosis bilateral with markedly distended urine bladder.  With supportive care alone (just normal thirst-driven fluid intake) his sodium level normalized over 36 hours. He has no symptoms at this time. Blood pressure has improved once home benzodiazepine and percocet were reordered. Clonidine was added with better control.   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 Na improved rapidly, history consistent with acute change.   Atypical chest discomfort -Echocardiogram as outpatient recommended. -Troponin 7>> 10 -Personally reviewed EKG--sinus rhythm, no concerning ST-T wave change   Hypertensive urgency:  - Continue home metoprolol, add clonidine 0.1mg  TID. PCP follow up important. BP elevation improved significantly once given percocet and xanax (?if symptoms and VS's related to withdrawal of these medications PTA)   Chronic back pain/opioid dependence -PDMP reviewed -Percocet 10/325, #120, last  refill 04/25/2023 -Xanax 1 mg, #60, last refill 04/17/2023 - No controlled prescriptions provided at discharge.   Anxiety -Xanax as discussed above   Leukemoid reaction -Lactic acid nl -PCT  low -UA neg -CT chest--no consolidation -CT abd--no acute findings   Folic acid deficiency: Supplement prescribed.   Diabetes mellitus type 2, controlled: Continue home Tx.   Hypomagnesemia -repleted   Acute urinary retention -foley placed in ED, removed 9/25 without issues voiding thereafter.   PDMP reviewed:  04/25/2023 03/26/2023  1 Alprazolam 1 Mg Tablet 60.00 30 Ch Mar [id number] Car ([id number]) 1/2 4.00 LME Private Pay [facility]  04/25/2023 04/23/2023  1 Oxycodone-Acetaminophen 10-325 120.00 30 De [place]uf [id number] Car ([id number]) 0/0 60.00 MME Private Pay [place]   Consultants: None Procedures performed: None  Disposition: Home Diet recommendation: Fluid restriction DISCHARGE MEDICATION: Allergies as of 05/23/2023       Reactions   Chlorpromazine Hcl Other (See Comments)   Decreased BP   Vicodin [hydrocodone-acetaminophen] Rash        Medication List     TAKE these medications    ALPRAZolam 1 MG tablet Commonly known as: XANAX Take 1 mg by mouth 2 (two) times daily.   atorvastatin 40 MG tablet Commonly known as: LIPITOR Take 40 mg by mouth daily.   butalbital-acetaminophen-caffeine 50-325-40 MG tablet Commonly known as: FIORICET Take 1 tablet by mouth daily as needed.   cloNIDine 0.1 MG tablet Commonly known as: CATAPRES Take 1 tablet (0.1 mg total) by mouth 3 (three) times daily.   cyclobenzaprine 10 MG tablet Commonly known as: FLEXERIL Take 10 mg by mouth 3 (three) times daily as needed for muscle spasms.   folic acid 1 MG tablet Commonly known as: FOLVITE Take 1 tablet (1 mg total) by mouth daily.   glipiZIDE 5 MG tablet Commonly known as: GLUCOTROL Take 5 mg by mouth daily before breakfast.   metoprolol tartrate 50 MG tablet Commonly known as: LOPRESSOR Take 50 mg by mouth 2 (two) times daily.   oxyCODONE-acetaminophen 10-325 MG tablet Commonly known as: PERCOCET Take 1 tablet by mouth 4 (four) times daily as needed.   pantoprazole 40 MG tablet Commonly known  as: PROTONIX Take 40 mg by mouth daily.        Follow-up Information     Benita Stabile, MD. Schedule an appointment as soon as possible for a visit.   Specialty: Internal Medicine Contact information: [address] [place] [address] [phone]                Discharge Exam: Filed Weights   05/21/23 1748  Weight: 99.8 kg  BP (!) 157/86   Pulse 70   Temp 98.2 F (36.8 C) (Oral)   Resp 16   Ht 6' (1.829 m)   Wt 99.8 kg   SpO2 92%   BMI 29.84 kg/m   Well-appearing male in no distress Clear, nonlabored RRR, no MRG or edema Soft, NT, ND Alert, oriented with no focal deficits  Condition at discharge: stable  The results of significant diagnostics from this hospitalization (including imaging, microbiology, ancillary and laboratory) are listed below for reference.   Imaging Studies: CT Angio Chest/Abd/Pel for Dissection W and/or Wo Contrast  Result Date: 05/21/2023 CLINICAL DATA:  Chest pressure and headache. EXAM: CT ANGIOGRAPHY CHEST, ABDOMEN AND PELVIS TECHNIQUE: Non-contrast CT of the chest was initially obtained. Multidetector CT imaging through the chest, abdomen and pelvis was performed using the standard protocol during bolus administration of intravenous contrast. Multiplanar reconstructed  images and MIPs were obtained and reviewed to evaluate the vascular anatomy. RADIATION DOSE REDUCTION: This exam was performed according to the departmental dose-optimization program which includes automated exposure control, adjustment of the mA and/or kV according to patient size and/or use of iterative reconstruction technique. CONTRAST:  OMNIPAQUE IOHEXOL 350 MG/ML SOLN COMPARISON:  September 03, 2013 FINDINGS: CTA CHEST FINDINGS Cardiovascular: There is mild calcification of the aortic arch, without evidence of aortic aneurysm or dissection. Satisfactory opacification of the pulmonary arteries to the segmental level. No evidence of pulmonary embolism. Normal  heart size. No pericardial effusion. Mediastinum/Nodes: No enlarged mediastinal, hilar, or axillary lymph nodes. Thyroid gland, trachea, and esophagus demonstrate no significant findings. Lungs/Pleura: Mild lingular, right upper lobe and bibasilar linear scarring and/or atelectasis is seen. There is no evidence of acute infiltrate, pleural effusion or pneumothorax. Musculoskeletal: No chest wall abnormality. No acute or significant osseous findings. Review of the MIP images confirms the above findings. CTA ABDOMEN AND PELVIS FINDINGS VASCULAR Aorta: Normal caliber aorta without aneurysm, dissection, vasculitis or significant stenosis. Celiac: Patent without evidence of aneurysm, dissection, vasculitis or significant stenosis. SMA: Patent without evidence of aneurysm, dissection, vasculitis or significant stenosis. Renals: Both renal arteries are patent without evidence of aneurysm, dissection, vasculitis, fibromuscular dysplasia or significant stenosis. IMA: Patent without evidence of aneurysm, dissection, vasculitis or significant stenosis. Inflow: Patent without evidence of aneurysm, dissection, vasculitis or significant stenosis. Veins: No obvious venous abnormality within the limitations of this arterial phase study. Review of the MIP images confirms the above findings. NON-VASCULAR Hepatobiliary: There is diffuse fatty infiltration of the liver parenchyma. No focal liver abnormality is seen. No gallstones, gallbladder wall thickening, or biliary dilatation. Pancreas: Unremarkable. No pancreatic ductal dilatation or surrounding inflammatory changes. Spleen: Normal in size without focal abnormality. Adrenals/Urinary Tract: Adrenal glands are unremarkable. Kidneys are normal in size. A 1.5 cm diameter simple cyst is seen within the anterior aspect of the mid left kidney. A 2 mm nonobstructing renal calculus is seen within the left kidney. There is mild, bilateral hydronephrosis and hydroureter, left greater than  right, without evidence of obstructing renal calculi. Urinary bladder is markedly distended. A mild amount of adjacent anterior inflammatory fat stranding is seen. Stomach/Bowel: Stomach is within normal limits. Appendix appears normal. No evidence of bowel wall thickening, distention, or inflammatory changes. Noninflamed diverticula are seen throughout the sigmoid colon. Lymphatic: No abnormal abdominal or pelvic lymph nodes are identified. Reproductive: Prostate gland is mildly enlarged. Other: No abdominal wall hernia or abnormality. No abdominopelvic ascites. Musculoskeletal: No acute or significant osseous findings. Review of the MIP images confirms the above findings. IMPRESSION: 1. No evidence of aortic aneurysm or dissection. 2. Mild lingular, right upper lobe and bibasilar linear scarring and/or atelectasis. 3. Hepatic steatosis. 4. 2 mm nonobstructing left renal calculus. 5. Sigmoid diverticulosis. 6. Mildly enlarged prostate gland. 7. Aortic atherosclerosis. Aortic Atherosclerosis (ICD10-I70.0). Electronically Signed   By: Aram Candela M.D.   On: 05/21/2023 20:39   CT Head Wo Contrast  Result Date: 05/21/2023 CLINICAL DATA:  Chest pressure and headache. EXAM: CT HEAD WITHOUT CONTRAST TECHNIQUE: Contiguous axial images were obtained from the base of the skull through the vertex without intravenous contrast. RADIATION DOSE REDUCTION: This exam was performed according to the departmental dose-optimization program which includes automated exposure control, adjustment of the mA and/or kV according to patient size and/or use of iterative reconstruction technique. COMPARISON:  None Available. FINDINGS: Brain: No evidence of acute infarction, hemorrhage, hydrocephalus, extra-axial collection or mass lesion/mass  effect. Vascular: No hyperdense vessel or unexpected calcification. Skull: Chronic bilateral nasal bone fractures are seen. Sinuses/Orbits: No acute finding. Other: None. IMPRESSION: 1. No acute  intracranial abnormality. Electronically Signed   By: Aram Candela M.D.   On: 05/21/2023 20:26   DG Chest Portable 1 View  Result Date: 05/21/2023 CLINICAL DATA:  Chest pressure and headache. EXAM: PORTABLE CHEST 1 VIEW COMPARISON:  December 09, 2011 FINDINGS: The cardiac silhouette is mildly enlarged. Low lung volumes are noted with subsequent crowding of the bronchovascular lung markings. There is no evidence of an acute infiltrate, pleural effusion or pneumothorax. The visualized skeletal structures are unremarkable. IMPRESSION: Low lung volumes without acute or active cardiopulmonary disease. Electronically Signed   By: Aram Candela M.D.   On: 05/21/2023 20:24    Microbiology: Results for orders placed or performed during the hospital encounter of 05/21/23  MRSA Next Gen by PCR, Nasal     Status: None   Collection Time: 05/21/23 11:31 PM   Specimen: Nasal Mucosa; Nasal Swab  Result Value Ref Range Status   MRSA by PCR Next Gen NOT DETECTED NOT DETECTED Final    Comment: (NOTE) The GeneXpert MRSA Assay (FDA approved for NASAL specimens only), is one component of a comprehensive MRSA colonization surveillance program. It is not intended to diagnose MRSA infection nor to guide or monitor treatment for MRSA infections. Test performance is not FDA approved in patients less than 70 years old. Performed at [facility], [address], [place], [place] [address]     Labs: CBC: Recent Labs  Lab 05/21/23 1815 05/21/23 2042 05/22/23 0447  WBC 17.3*  --  16.0*  NEUTROABS 13.6*  --   --   HGB 15.3 16.0 15.6  HCT 43.0 47.0 43.1  MCV 84.0  --  84.2  PLT 265  --  280   Basic Metabolic Panel: Recent Labs  Lab 05/21/23 2032 05/21/23 2042 05/21/23 2246 05/22/23 0447 05/22/23 1135 05/23/23 0515  NA 120* 123* 119* 126* 132* 137  K 2.8* 3.0* 2.8* 3.2* 3.9 3.4*  CL 88* 88* 86* 91* 96* 100  CO2 18*  --  19* 24 25 24   GLUCOSE 170* 170* 194* 162* 112* 104*  BUN 5* 3* 5* 6 8 11    CREATININE 0.47* 0.40* 0.53* 0.57* 0.68 0.71  CALCIUM 7.8*  --  8.1* 8.4* 8.5* 8.6*  MG  --   --   --  1.3*  --  2.1  PHOS  --   --   --  3.7  --   --    Liver Function Tests: Recent Labs  Lab 05/21/23 1815 05/22/23 0447  AST 42* 37  ALT 37 31  ALKPHOS 48 47  BILITOT 1.0 1.2  PROT 7.4 7.4  ALBUMIN 4.2 4.0   CBG: Recent Labs  Lab 05/22/23 1622 05/22/23 1938 05/23/23 1114  GLUCAP 154* 168* 102*    Discharge time spent: greater than 30 minutes.  Signed: Tyrone Nine, MD [facility] 05/23/2023

## 2023-06-14 ENCOUNTER — Encounter (HOSPITAL_COMMUNITY): Payer: Self-pay

## 2023-06-14 ENCOUNTER — Other Ambulatory Visit: Payer: Self-pay

## 2023-06-14 ENCOUNTER — Observation Stay (HOSPITAL_COMMUNITY)
Admission: EM | Admit: 2023-06-14 | Discharge: 2023-06-15 | Disposition: A | Discharge status: Home / Self Care | Payer: Medicaid Other | Attending: Internal Medicine | Admitting: Internal Medicine

## 2023-06-14 ENCOUNTER — Emergency Department (HOSPITAL_COMMUNITY): Payer: Medicaid Other

## 2023-06-14 DIAGNOSIS — I10 Essential (primary) hypertension: Secondary | ICD-10-CM | POA: Diagnosis not present

## 2023-06-14 DIAGNOSIS — I169 Hypertensive crisis, unspecified: Secondary | ICD-10-CM | POA: Diagnosis not present

## 2023-06-14 DIAGNOSIS — G8929 Other chronic pain: Secondary | ICD-10-CM | POA: Diagnosis present

## 2023-06-14 DIAGNOSIS — Z720 Tobacco use: Secondary | ICD-10-CM | POA: Diagnosis present

## 2023-06-14 DIAGNOSIS — Z7984 Long term (current) use of oral hypoglycemic drugs: Secondary | ICD-10-CM | POA: Insufficient documentation

## 2023-06-14 DIAGNOSIS — F17210 Nicotine dependence, cigarettes, uncomplicated: Secondary | ICD-10-CM | POA: Insufficient documentation

## 2023-06-14 DIAGNOSIS — K219 Gastro-esophageal reflux disease without esophagitis: Secondary | ICD-10-CM | POA: Diagnosis present

## 2023-06-14 DIAGNOSIS — R0789 Other chest pain: Secondary | ICD-10-CM | POA: Diagnosis present

## 2023-06-14 DIAGNOSIS — F419 Anxiety disorder, unspecified: Secondary | ICD-10-CM | POA: Diagnosis present

## 2023-06-14 DIAGNOSIS — Z79899 Other long term (current) drug therapy: Secondary | ICD-10-CM | POA: Insufficient documentation

## 2023-06-14 DIAGNOSIS — R519 Headache, unspecified: Secondary | ICD-10-CM

## 2023-06-14 LAB — COMPREHENSIVE METABOLIC PANEL
ALT: 41 U/L (ref 0–44)
AST: 37 U/L (ref 15–41)
Albumin: 3.9 g/dL (ref 3.5–5.0)
Alkaline Phosphatase: 42 U/L (ref 38–126)
Anion gap: 12 (ref 5–15)
BUN: 11 mg/dL (ref 6–20)
CO2: 23 mmol/L (ref 22–32)
Calcium: 9.1 mg/dL (ref 8.9–10.3)
Chloride: 100 mmol/L (ref 98–111)
Creatinine, Ser: 0.64 mg/dL (ref 0.61–1.24)
GFR, Estimated: >60 mL/min (ref >60)
Glucose, Bld: 170 mg/dL — ABNORMAL HIGH (ref 70–99)
Potassium: 3.8 mmol/L (ref 3.5–5.1)
Sodium: 135 mmol/L (ref 135–145)
Total Bilirubin: 0.6 mg/dL (ref 0.3–1.2)
Total Protein: 7.7 g/dL (ref 6.5–8.1)

## 2023-06-14 LAB — MRSA NEXT GEN BY PCR, NASAL: MRSA by PCR Next Gen: NOT DETECTED

## 2023-06-14 LAB — CBC WITH DIFFERENTIAL/PLATELET
Abs Immature Granulocytes: 0.04 10*3/uL (ref 0.00–0.07)
Basophils Absolute: 0.1 10*3/uL (ref 0.0–0.1)
Basophils Relative: 1 %
Eosinophils Absolute: 0.2 10*3/uL (ref 0.0–0.5)
Eosinophils Relative: 2 %
HCT: 45.8 % (ref 39.0–52.0)
Hemoglobin: 15.5 g/dL (ref 13.0–17.0)
Immature Granulocytes: 0 %
Lymphocytes Relative: 49 %
Lymphs Abs: 4.9 10*3/uL — ABNORMAL HIGH (ref 0.7–4.0)
MCH: 30.5 pg (ref 26.0–34.0)
MCHC: 33.8 g/dL (ref 30.0–36.0)
MCV: 90 fL (ref 80.0–100.0)
Monocytes Absolute: 0.6 10*3/uL (ref 0.1–1.0)
Monocytes Relative: 6 %
Neutro Abs: 4.1 10*3/uL (ref 1.7–7.7)
Neutrophils Relative %: 42 %
Platelets: 328 10*3/uL (ref 150–400)
RBC: 5.09 MIL/uL (ref 4.22–5.81)
RDW: 12 % (ref 11.5–15.5)
WBC: 9.9 10*3/uL (ref 4.0–10.5)
nRBC: 0 % (ref 0.0–0.2)

## 2023-06-14 LAB — GLUCOSE, CAPILLARY
Glucose-Capillary: 216 mg/dL — ABNORMAL HIGH (ref 70–99)
Glucose-Capillary: 139 mg/dL — ABNORMAL HIGH (ref 70–99)

## 2023-06-14 LAB — BRAIN NATRIURETIC PEPTIDE: B Natriuretic Peptide: 42 pg/mL (ref 0.0–100.0)

## 2023-06-14 LAB — TSH: TSH: 0.795 u[IU]/mL (ref 0.350–4.500)

## 2023-06-14 LAB — TROPONIN I (HIGH SENSITIVITY): Troponin I (High Sensitivity): 4 ng/L (ref <18)

## 2023-06-14 MED ORDER — ONDANSETRON HCL 4 MG/2ML IJ SOLN: 4.0000 mg | Freq: Four times a day (QID) | INTRAMUSCULAR | Status: DC | PRN

## 2023-06-14 MED ORDER — OXYCODONE-ACETAMINOPHEN 10-325 MG PO TABS: 1.0000 | ORAL_TABLET | Freq: Four times a day (QID) | ORAL | Status: DC | PRN

## 2023-06-14 MED ORDER — ACETAMINOPHEN 325 MG PO TABS: 650.0000 mg | ORAL_TABLET | Freq: Four times a day (QID) | ORAL | Status: DC | PRN

## 2023-06-14 MED ORDER — METOPROLOL TARTRATE 50 MG PO TABS
50.0000 mg | ORAL_TABLET | Freq: Once | ORAL | Status: AC
  Administered 2023-06-14: 50 mg via ORAL
  Filled 2023-06-14: qty 1

## 2023-06-14 MED ORDER — ONDANSETRON HCL 4 MG PO TABS: 4.0000 mg | ORAL_TABLET | Freq: Four times a day (QID) | ORAL | Status: DC | PRN

## 2023-06-14 MED ORDER — NICARDIPINE HCL IN NACL 20-0.86 MG/200ML-% IV SOLN
3.0000 mg/h | INTRAVENOUS | Status: DC
  Administered 2023-06-14: 5 mg/h via INTRAVENOUS
  Administered 2023-06-14: 7.5 mg/h via INTRAVENOUS
  Administered 2023-06-15 (×2): 3 mg/h via INTRAVENOUS
  Filled 2023-06-14 (×4): qty 200

## 2023-06-14 MED ORDER — ACETAMINOPHEN 650 MG RE SUPP: 650.0000 mg | Freq: Four times a day (QID) | RECTAL | Status: DC | PRN

## 2023-06-14 MED ORDER — PROCHLORPERAZINE EDISYLATE 10 MG/2ML IJ SOLN
10.0000 mg | Freq: Once | INTRAMUSCULAR | Status: AC
  Administered 2023-06-14: 10 mg via INTRAVENOUS
  Filled 2023-06-14: qty 2

## 2023-06-14 MED ORDER — ENOXAPARIN SODIUM 40 MG/0.4ML IJ SOSY
40.0000 mg | PREFILLED_SYRINGE | INTRAMUSCULAR | Status: DC
  Administered 2023-06-14: 40 mg via SUBCUTANEOUS
  Filled 2023-06-14: qty 0.4

## 2023-06-14 MED ORDER — ALPRAZOLAM 0.5 MG PO TABS
1.0000 mg | ORAL_TABLET | Freq: Once | ORAL | Status: AC
  Administered 2023-06-14: 1 mg via ORAL
  Filled 2023-06-14: qty 2

## 2023-06-14 MED ORDER — ACETAMINOPHEN 500 MG PO TABS
1000.0000 mg | ORAL_TABLET | Freq: Once | ORAL | Status: DC
  Filled 2023-06-14: qty 2

## 2023-06-14 MED ORDER — PANTOPRAZOLE SODIUM 40 MG PO TBEC
40.0000 mg | DELAYED_RELEASE_TABLET | Freq: Every day | ORAL | Status: DC
  Administered 2023-06-14 – 2023-06-15 (×2): 40 mg via ORAL
  Filled 2023-06-14 (×2): qty 1

## 2023-06-14 MED ORDER — DIPHENHYDRAMINE HCL 50 MG/ML IJ SOLN
50.0000 mg | Freq: Once | INTRAMUSCULAR | Status: AC
  Administered 2023-06-14: 50 mg via INTRAVENOUS
  Filled 2023-06-14: qty 1

## 2023-06-14 MED ORDER — DIAZEPAM 5 MG PO TABS
5.0000 mg | ORAL_TABLET | Freq: Once | ORAL | Status: AC
  Administered 2023-06-14: 5 mg via ORAL
  Filled 2023-06-14: qty 1

## 2023-06-14 MED ORDER — LABETALOL HCL 5 MG/ML IV SOLN
20.0000 mg | Freq: Once | INTRAVENOUS | Status: AC
  Administered 2023-06-14: 20 mg via INTRAVENOUS
  Filled 2023-06-14: qty 4

## 2023-06-14 MED ORDER — ATORVASTATIN CALCIUM 40 MG PO TABS
40.0000 mg | ORAL_TABLET | Freq: Every day | ORAL | Status: DC
  Administered 2023-06-14 – 2023-06-15 (×2): 40 mg via ORAL
  Filled 2023-06-14 (×2): qty 1

## 2023-06-14 MED ORDER — OXYCODONE-ACETAMINOPHEN 5-325 MG PO TABS
2.0000 | ORAL_TABLET | Freq: Once | ORAL | Status: AC
  Administered 2023-06-14: 2 via ORAL
  Filled 2023-06-14: qty 2

## 2023-06-14 MED ORDER — ALPRAZOLAM 0.5 MG PO TABS
1.0000 mg | ORAL_TABLET | Freq: Three times a day (TID) | ORAL | Status: DC
  Administered 2023-06-14 – 2023-06-15 (×3): 1 mg via ORAL
  Filled 2023-06-14 (×3): qty 2

## 2023-06-14 MED ORDER — KETOROLAC TROMETHAMINE 30 MG/ML IJ SOLN
15.0000 mg | Freq: Once | INTRAMUSCULAR | Status: AC
  Administered 2023-06-14: 15 mg via INTRAVENOUS
  Filled 2023-06-14: qty 1

## 2023-06-14 MED ORDER — OXYCODONE HCL 5 MG PO TABS
5.0000 mg | ORAL_TABLET | Freq: Four times a day (QID) | ORAL | Status: DC | PRN
  Administered 2023-06-14 – 2023-06-15 (×3): 5 mg via ORAL
  Filled 2023-06-14 (×3): qty 1

## 2023-06-14 MED ORDER — OXYCODONE-ACETAMINOPHEN 5-325 MG PO TABS
1.0000 | ORAL_TABLET | Freq: Once | ORAL | Status: AC
  Administered 2023-06-14: 1 via ORAL

## 2023-06-14 MED ORDER — CLONIDINE HCL 0.1 MG PO TABS
0.1000 mg | ORAL_TABLET | Freq: Once | ORAL | Status: AC
  Administered 2023-06-14: 0.1 mg via ORAL
  Filled 2023-06-14: qty 1

## 2023-06-14 MED ORDER — CLONIDINE HCL 0.1 MG PO TABS
0.1000 mg | ORAL_TABLET | Freq: Three times a day (TID) | ORAL | Status: DC
  Administered 2023-06-14 – 2023-06-15 (×3): 0.1 mg via ORAL
  Filled 2023-06-14 (×3): qty 1

## 2023-06-14 MED ORDER — LABETALOL HCL 5 MG/ML IV SOLN: 10.0000 mg | Freq: Once | INTRAVENOUS | Status: DC

## 2023-06-14 MED ORDER — FOLIC ACID 1 MG PO TABS
1.0000 mg | ORAL_TABLET | Freq: Every day | ORAL | Status: DC
  Administered 2023-06-14 – 2023-06-15 (×2): 1 mg via ORAL
  Filled 2023-06-14 (×2): qty 1

## 2023-06-14 MED ORDER — BUTALBITAL-APAP-CAFFEINE 50-325-40 MG PO TABS
1.0000 | ORAL_TABLET | Freq: Four times a day (QID) | ORAL | Status: DC | PRN
  Administered 2023-06-14 – 2023-06-15 (×3): 1 via ORAL
  Filled 2023-06-14 (×3): qty 1

## 2023-06-14 MED ORDER — METOPROLOL TARTRATE 50 MG PO TABS
50.0000 mg | ORAL_TABLET | Freq: Two times a day (BID) | ORAL | Status: DC
  Administered 2023-06-14 – 2023-06-15 (×2): 50 mg via ORAL
  Filled 2023-06-14 (×2): qty 1

## 2023-06-14 MED ORDER — OXYCODONE-ACETAMINOPHEN 5-325 MG PO TABS
1.0000 | ORAL_TABLET | Freq: Four times a day (QID) | ORAL | Status: DC | PRN
  Administered 2023-06-14 – 2023-06-15 (×3): 1 via ORAL
  Filled 2023-06-14 (×3): qty 1

## 2023-06-14 MED ORDER — INSULIN ASPART 100 UNIT/ML IJ SOLN
0.0000 [IU] | Freq: Three times a day (TID) | INTRAMUSCULAR | Status: DC
  Administered 2023-06-14: 3 [IU] via SUBCUTANEOUS
  Administered 2023-06-15: 1 [IU] via SUBCUTANEOUS

## 2023-06-14 MED ORDER — INSULIN ASPART 100 UNIT/ML IJ SOLN: 0.0000 [IU] | Freq: Every day | INTRAMUSCULAR | Status: DC

## 2023-06-14 MED ORDER — DEXAMETHASONE SODIUM PHOSPHATE 10 MG/ML IJ SOLN
10.0000 mg | Freq: Once | INTRAMUSCULAR | Status: AC
  Administered 2023-06-14: 10 mg via INTRAVENOUS
  Filled 2023-06-14: qty 1

## 2023-06-14 MED ORDER — CHLORHEXIDINE GLUCONATE CLOTH 2 % EX PADS
6.0000 | MEDICATED_PAD | Freq: Every day | CUTANEOUS | Status: DC
  Administered 2023-06-15: 6 via TOPICAL

## 2023-06-14 MED ORDER — CYCLOBENZAPRINE HCL 10 MG PO TABS
10.0000 mg | ORAL_TABLET | Freq: Three times a day (TID) | ORAL | Status: DC | PRN
  Administered 2023-06-14: 10 mg via ORAL
  Filled 2023-06-14: qty 1

## 2023-06-14 NOTE — ED Provider Notes (Signed)
Received handoff from Dr. Clayborne Dana overnight; 52 year old with anxiety and elevated blood pressure.  Largely reassuring workup, blood pressure remains elevated.  Complaining of headache; disposition pending migraine cocktail and reevaluation.  Patient continues to have elevated blood pressure and complains of chest pain and shortness of breath.  Headache somewhat improved. Physical Exam  BP (!) 160/100   Pulse (!) 108   Temp 97.7 F (36.5 C) (Oral)   Resp 19   Wt 104.3 kg   SpO2 94%   BMI 31.19 kg/m   Physical Exam Vitals reviewed.  Constitutional:      Appearance: Normal appearance.  Cardiovascular:     Pulses: Normal pulses.  Pulmonary:     Effort: Pulmonary effort is normal.  Abdominal:     General: Abdomen is flat. There is no distension.     Tenderness: There is no abdominal tenderness. There is no guarding or rebound.  Skin:    General: Skin is warm and dry.     Capillary Refill: Capillary refill takes less than 2 seconds.  Neurological:     Mental Status: He is alert and oriented to person, place, and time.  Psychiatric:     Comments: Anxious appearing     Procedures  Procedures  ED Course / MDM   Clinical Course as of 06/14/23 1314  Thu Jun 14, 2023  4782 Headache improved, but states panic attack now worsened after getting migraine cocktail.  Blood pressure in 200s.  Will redose Xanax and give home metoprolol for blood pressure control. [TY]  1049 Blood pressures remained in the 200s despite home medications and multiple adjunctive medications for his headache and anxiety.  Continues to endorse chest pain, shortness of breath [TY]    Clinical Course User Index [TY] Coral Spikes, DO   Medical Decision Making 52 year old male presented with hypertension, headache and panic attack.  Extremely hyper tensive with blood pressure in the 200s systolic.  Workup done by Dr. Clayborne Dana, overnight largely reassuring.  See his note for further HPI.  On reevaluation, patient  continued to complain of chest pain/tightness and elevated blood pressure.  Blood pressure remains in the 200s despite home medications, anxiolysis and migraine cocktail.  Trialing IV labetalol, will reach out to hospitalist for possible admission for hypertensive urgency and chest pain observation.  Amount and/or Complexity of Data Reviewed Labs: ordered. Radiology: ordered. ECG/medicine tests: ordered.  Risk Prescription drug management. Decision regarding hospitalization.         Coral Spikes, DO 06/14/23 1314

## 2023-06-14 NOTE — ED Notes (Signed)
Pt received migraine cocktail and is now experiencing SOB, palpations, and a slightly increased BP. MD made aware.

## 2023-06-14 NOTE — ED Triage Notes (Addendum)
Pt states that he woke up in a panic which made his blood pressure elevated. BP was 230/120. Pt takes xanax at home for anxiety. Pt states that this is a cycle that keeps happening from his panic attacks. Endorses headache.  BP 209/118 in triage.

## 2023-06-14 NOTE — ED Notes (Signed)
Pt states anxiety has increased and BP 201/117. EDP notified.

## 2023-06-14 NOTE — ED Provider Notes (Signed)
[place] EMERGENCY DEPARTMENT AT [facility] Provider Note   CSN: [id number] Arrival date & time: 06/14/23  0456     History  Chief Complaint  Patient presents with   Panic Attack   Hypertension    Eric [name] is a 52 y.o. male.  Patient presents to the emerged part today secondary to concerns for high blood pressure and panic attacks.  Patient states these have become more frequent.  States has had a multiple times in the past however this morning was not able to get better with just breathing exercises.  Also takes Xanax at home which did not seem to help.  Had a headache with it which is not new he oftentimes has migraines.  Also has neck and back pain which is also not new.  No other new neurologic changes.  Had some chest pain when he was having a panic attack and shortness of breath.  No new lower extremity swelling.  No other associated symptoms.  No recent fever or cough.  Highest blood pressure at home was 230 systolic.   Hypertension       Home Medications Prior to Admission medications   Medication Sig Start Date End Date Taking? Authorizing Provider  ALPRAZolam Prudy Feeler) 1 MG tablet Take 1 mg by mouth 2 (two) times daily.    [provider]  atorvastatin (LIPITOR) 40 MG tablet Take 40 mg by mouth daily.    [provider]  butalbital-acetaminophen-caffeine (FIORICET) 50-325-40 MG tablet Take 1 tablet by mouth daily as needed. 05/21/23   [provider]  cloNIDine (CATAPRES) 0.1 MG tablet Take 1 tablet (0.1 mg total) by mouth 3 (three) times daily. 05/23/23   Tyrone Nine, MD  cyclobenzaprine (FLEXERIL) 10 MG tablet Take 10 mg by mouth 3 (three) times daily as needed for muscle spasms.    [provider]  folic acid (FOLVITE) 1 MG tablet Take 1 tablet (1 mg total) by mouth daily. 05/23/23 05/22/24  Tyrone Nine, MD  glipiZIDE (GLUCOTROL) 5 MG tablet Take 5 mg by mouth daily before breakfast.    [provider]  metoprolol tartrate (LOPRESSOR) 50 MG tablet Take 50 mg by mouth 2 (two) times daily.    [provider]  oxyCODONE-acetaminophen (PERCOCET) 10-325 MG tablet Take 1 tablet by mouth 4 (four) times daily as needed. 04/25/23   [provider]  pantoprazole (PROTONIX) 40 MG tablet Take 40 mg by mouth daily. 03/21/23   [provider]      Allergies    Chlorpromazine hcl and Vicodin [hydrocodone-acetaminophen]    Review of Systems   Review of Systems  Physical Exam Updated Vital Signs BP (!) 170/103   Pulse 74   Temp (!) 97.2 F (36.2 C)   Resp (!) 23   Wt 104.3 kg   SpO2 97%   BMI 31.19 kg/m  Physical Exam Vitals and nursing note reviewed.  Constitutional:      Appearance: He is well-developed.  HENT:     Head: Normocephalic and atraumatic.  Eyes:     Pupils: Pupils are equal, round, and reactive to light.  Cardiovascular:     Rate and Rhythm: Normal rate.     Comments: Strong DP and radial pulses Pulmonary:     Effort: Pulmonary effort is normal. No respiratory distress.  Abdominal:     General: There is no distension.     Tenderness: There is no abdominal tenderness.  Musculoskeletal:  General: Normal range of motion.     Cervical back: Normal range of motion.  Skin:    General: Skin is warm and dry.  Neurological:     General: No focal deficit present.     Mental Status: He is alert. Mental status is at baseline.     ED Results / Procedures / Treatments   Labs (all labs ordered are listed, but only abnormal results are displayed) Labs Reviewed  CBC WITH DIFFERENTIAL/PLATELET - Abnormal; Notable for the following components:      Result Value   Lymphs Abs 4.9 (*)    All other components within normal limits  COMPREHENSIVE METABOLIC PANEL - Abnormal; Notable for the following components:   Glucose, Bld 170 (*)    All other components within normal limits  BRAIN NATRIURETIC PEPTIDE  TROPONIN I (HIGH SENSITIVITY)     EKG EKG Interpretation Date/Time:  Thursday June 14 2023 05:42:37 EDT Ventricular Rate:  73 PR Interval:  166 QRS Duration:  99 QT Interval:  393 QTC Calculation: 433 R Axis:   -48  Text Interpretation: Sinus rhythm Left anterior fascicular block Confirmed by Marily Memos [phone]) on 06/14/2023 6:06:30 AM  Radiology CT Head Wo Contrast  Result Date: 06/14/2023 CLINICAL DATA:  Headache with increasing frequency or severity EXAM: CT HEAD WITHOUT CONTRAST TECHNIQUE: Contiguous axial images were obtained from the base of the skull through the vertex without intravenous contrast. RADIATION DOSE REDUCTION: This exam was performed according to the departmental dose-optimization program which includes automated exposure control, adjustment of the mA and/or kV according to patient size and/or use of iterative reconstruction technique. COMPARISON:  05/21/2023 FINDINGS: Brain: No evidence of acute infarction, hemorrhage, hydrocephalus, extra-axial collection or mass lesion/mass effect. Vascular: No hyperdense vessel or unexpected calcification. Skull: Normal. Negative for fracture or focal lesion. Sinuses/Orbits: No acute finding. IMPRESSION: Negative head CT. Electronically Signed   By: Tiburcio Pea M.D.   On: 06/14/2023 06:47    Procedures Procedures    Medications Ordered in ED Medications  diazepam (VALIUM) tablet 5 mg (5 mg Oral Given 06/14/23 0545)  oxyCODONE-acetaminophen (PERCOCET/ROXICET) 5-325 MG per tablet 2 tablet (2 tablets Oral Given 06/14/23 0545)    ED Course/ Medical Decision Making/ A&P                                Medical Decision Making Amount and/or Complexity of Data Reviewed Labs: ordered. Radiology: ordered. ECG/medicine tests: ordered.  Risk Prescription drug management. Decision regarding hospitalization.  Will check labs and imaging based on his symptoms to evaluate for any evidence of endorgan damage related to the blood pressure.  CT head  reviewed and interpreted by myself without any obvious stroke or bleed.  There are couple punctate areas of hyperintense signal but these were present on previous CT as well.  Labs without significant abnormality.  Hyponatremia has resolved. Workup is reassuring however still with headache. BP slightly improved, will continue treatment for same. Will give migraine cocktail and will need reassessment for symptoms and disposition. Care transferred pending same.    Final Clinical Impression(s) / ED Diagnoses Final diagnoses:  None    Rx / DC Orders ED Discharge Orders     None         Trystian Crisanto, Barbara Cower, MD 06/16/23 [phone]

## 2023-06-14 NOTE — ED Notes (Signed)
Pt requested medication for migraine. EDP notified.
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
## 2023-06-14 NOTE — ED Notes (Signed)
Pt reports chest pain and shortness of breath now, after 0740 med administration. He appears very anxious and uncomfortable. Vital signs reassessed with noted increase in HR.

## 2023-06-15 DIAGNOSIS — I169 Hypertensive crisis, unspecified: Secondary | ICD-10-CM | POA: Diagnosis not present

## 2023-06-15 LAB — CBC
HCT: 48.1 % (ref 39.0–52.0)
Hemoglobin: 16.5 g/dL (ref 13.0–17.0)
MCH: 30.4 pg (ref 26.0–34.0)
MCHC: 34.3 g/dL (ref 30.0–36.0)
MCV: 88.6 fL (ref 80.0–100.0)
Platelets: 348 10*3/uL (ref 150–400)
RBC: 5.43 MIL/uL (ref 4.22–5.81)
RDW: 12.3 % (ref 11.5–15.5)
WBC: 11.6 10*3/uL — ABNORMAL HIGH (ref 4.0–10.5)
nRBC: 0 % (ref 0.0–0.2)

## 2023-06-15 LAB — BASIC METABOLIC PANEL
Anion gap: 12 (ref 5–15)
BUN: 12 mg/dL (ref 6–20)
CO2: 24 mmol/L (ref 22–32)
Calcium: 9.3 mg/dL (ref 8.9–10.3)
Chloride: 100 mmol/L (ref 98–111)
Creatinine, Ser: 0.62 mg/dL (ref 0.61–1.24)
GFR, Estimated: >60 mL/min (ref >60)
Glucose, Bld: 135 mg/dL — ABNORMAL HIGH (ref 70–99)
Potassium: 3.5 mmol/L (ref 3.5–5.1)
Sodium: 136 mmol/L (ref 135–145)

## 2023-06-15 LAB — MAGNESIUM: Magnesium: 1.8 mg/dL (ref 1.7–2.4)

## 2023-06-15 LAB — GLUCOSE, CAPILLARY
Glucose-Capillary: 124 mg/dL — ABNORMAL HIGH (ref 70–99)
Glucose-Capillary: 117 mg/dL — ABNORMAL HIGH (ref 70–99)

## 2023-06-15 MED ORDER — ALPRAZOLAM 0.5 MG PO TABS
1.0000 mg | ORAL_TABLET | Freq: Once | ORAL | Status: AC
  Administered 2023-06-15: 1 mg via ORAL
  Filled 2023-06-15: qty 2

## 2023-06-15 MED ORDER — ALPRAZOLAM 1 MG PO TABS: 1.0000 mg | ORAL_TABLET | Freq: Three times a day (TID) | ORAL | 0 refills | Status: AC

## 2023-06-15 NOTE — Progress Notes (Signed)
Patient alert and oriented x4. No current complaints of pain, shortness of breath, chest pain, dizziness, nausea or vomiting. Patient up out of bed, ambulatory independently with steady gait. Cardene drip titrated off at around 1030. Blood pressures monitored q30 minutes. Dr Sherryll Burger aware. IV removed with no signs of complications. Went over discharge summary, follow up information, medication education with patient. All questions answered and patient expressed full understanding of summary, information and education. Patient discharged with all belongings for home via car.

## 2023-06-15 NOTE — Discharge Summary (Signed)
Physician Discharge Summary  Eric [name] KXF:[id number] DOB: [birth year] DOA: 06/14/2023  PCP: Benita Stabile, MD  Admit date: 06/14/2023  Discharge date: 06/15/2023  Admitted From:Home  Disposition:  Home  Recommendations for Outpatient Follow-up:  Follow up with PCP in 1-2 weeks Continue on current blood pressure medications and monitor blood pressures closely outpatient Continue now on Xanax as prescribed 1 mg 3 times daily for better anxiety control and follow-up with PCP outpatient Continue home pain medications  Home Health: None  Equipment/Devices: None  Discharge Condition:Stable  CODE STATUS: Full  Diet recommendation: Heart Healthy  Brief/Interim Summary: Eric [name] is a 52 y.o. male with medical history significant for hypertension, anxiety, chronic back pain with opiate dependence, and dyslipidemia who presented with elevated blood pressure as well as more frequent panic attacks and some mild chest discomfort.  He states that he has been having frequent episodes since his last discharge on 05/23/2023 and has been taking his Xanax 3 times daily as opposed to his prescribed twice a day.  He is also complaining of headaches as well as neck and back pain which is chronic for him.  He states that he ran out of his Xanax a couple days ago and since then claims that his anxiety is worsening and he feels this is what is contributing to his blood pressure.  He claims to be compliant with his home blood pressure medications.   He was admitted for atypical chest pain and symptoms related to hypertensive urgency as well as anxiety/panic attacks which seem to be contributing to his elevated blood pressure readings.  He required use of IV Cardene drip to get better blood pressure control and is now back to his standard blood pressure medication dosages with control over his anxiety and is now in overall stable condition for discharge.  He has been given refills on Xanax with  increased dose of 3 times a day to better help control his anxiety which should in turn, control his blood pressures.  No other acute events or concerns noted and he is in stable condition for discharge.  Discharge Diagnoses:  Principal Problem:   Hypertensive crisis Active Problems:   Anxiety   GERD   Chronic back pain   Atypical chest pain   Tobacco abuse  Principal discharge diagnosis: Hypertensive urgency secondary to poorly controlled anxiety/panic attacks.  Discharge Instructions  Discharge Instructions     Diet - low sodium heart healthy   Complete by: As directed    Increase activity slowly   Complete by: As directed       Allergies as of 06/15/2023       Reactions   Chlorpromazine Hcl Other (See Comments)   Decreased BP   Tramadol Palpitations   "Heart racing" and "out of body experiences"   Vicodin [hydrocodone-acetaminophen] Rash        Medication List     TAKE these medications    ALPRAZolam 1 MG tablet Commonly known as: XANAX Take 1 tablet (1 mg total) by mouth 3 (three) times daily. What changed: when to take this   atorvastatin 40 MG tablet Commonly known as: LIPITOR Take 40 mg by mouth daily.   bisacodyl 5 MG EC tablet Commonly known as: DULCOLAX Take 5 mg by mouth daily as needed for moderate constipation.   butalbital-acetaminophen-caffeine 50-325-40 MG tablet Commonly known as: FIORICET Take 1 tablet by mouth daily as needed.   cloNIDine 0.1 MG tablet Commonly known as: CATAPRES Take 1 tablet (0.1  mg total) by mouth 3 (three) times daily.   cyclobenzaprine 10 MG tablet Commonly known as: FLEXERIL Take 10 mg by mouth 3 (three) times daily as needed for muscle spasms.   Fish Oil 1000 MG Caps Take 1 capsule by mouth in the morning and at bedtime.   folic acid 1 MG tablet Commonly known as: FOLVITE Take 1 tablet (1 mg total) by mouth daily.   glipiZIDE 5 MG tablet Commonly known as: GLUCOTROL Take 5 mg by mouth daily before  breakfast.   metoprolol tartrate 50 MG tablet Commonly known as: LOPRESSOR Take 50 mg by mouth 2 (two) times daily.   oxyCODONE-acetaminophen 10-325 MG tablet Commonly known as: PERCOCET Take 1 tablet by mouth 4 (four) times daily as needed.   pantoprazole 40 MG tablet Commonly known as: PROTONIX Take 40 mg by mouth daily.        Follow-up Information     Benita Stabile, MD. Schedule an appointment as soon as possible for a visit .   Specialty: Internal Medicine Why: long term anxiety control medications Contact information: [address] [facility] [address] [phone]         [facility] Department at [facility] .   Specialty: Emergency Medicine Why: If symptoms worsen Contact information: [address] [place] [address] [phone]               Allergies  Allergen Reactions   Chlorpromazine Hcl Other (See Comments)    Decreased BP   Tramadol Palpitations    "Heart racing" and "out of body experiences"   Vicodin [Hydrocodone-Acetaminophen] Rash    Consultations: None   Procedures/Studies: DG Chest 2 View  Result Date: 06/14/2023 CLINICAL DATA:  Chest pain and anxiety. EXAM: CHEST - 2 VIEW COMPARISON:  05/21/2023 FINDINGS: Elevated right diaphragm on a chronic basis. Mild linear opacification in the right lower lung from atelectasis or scarring. There is no edema, consolidation, effusion, or pneumothorax. Spondylosis. IMPRESSION: No acute finding. Electronically Signed   By: Tiburcio Pea M.D.   On: 06/14/2023 07:17   CT Head Wo Contrast  Result Date: 06/14/2023 CLINICAL DATA:  Headache with increasing frequency or severity EXAM: CT HEAD WITHOUT CONTRAST TECHNIQUE: Contiguous axial images were obtained from the base of the skull through the vertex without intravenous contrast. RADIATION DOSE REDUCTION: This exam was performed according to the departmental dose-optimization program which includes  automated exposure control, adjustment of the mA and/or kV according to patient size and/or use of iterative reconstruction technique. COMPARISON:  05/21/2023 FINDINGS: Brain: No evidence of acute infarction, hemorrhage, hydrocephalus, extra-axial collection or mass lesion/mass effect. Vascular: No hyperdense vessel or unexpected calcification. Skull: Normal. Negative for fracture or focal lesion. Sinuses/Orbits: No acute finding. IMPRESSION: Negative head CT. Electronically Signed   By: Tiburcio Pea M.D.   On: 06/14/2023 06:47   CT Angio Chest/Abd/Pel for Dissection W and/or Wo Contrast  Result Date: 05/21/2023 CLINICAL DATA:  Chest pressure and headache. EXAM: CT ANGIOGRAPHY CHEST, ABDOMEN AND PELVIS TECHNIQUE: Non-contrast CT of the chest was initially obtained. Multidetector CT imaging through the chest, abdomen and pelvis was performed using the standard protocol during bolus administration of intravenous contrast. Multiplanar reconstructed images and MIPs were obtained and reviewed to evaluate the vascular anatomy. RADIATION DOSE REDUCTION: This exam was performed according to the departmental dose-optimization program which includes automated exposure control, adjustment of the mA and/or kV according to patient size and/or use of iterative reconstruction technique. CONTRAST:  OMNIPAQUE IOHEXOL 350 MG/ML SOLN COMPARISON:  September 03, 2013 FINDINGS: CTA CHEST FINDINGS Cardiovascular: There is mild calcification of the aortic arch, without evidence of aortic aneurysm or dissection. Satisfactory opacification of the pulmonary arteries to the segmental level. No evidence of pulmonary embolism. Normal heart size. No pericardial effusion. Mediastinum/Nodes: No enlarged mediastinal, hilar, or axillary lymph nodes. Thyroid gland, trachea, and esophagus demonstrate no significant findings. Lungs/Pleura: Mild lingular, right upper lobe and bibasilar linear scarring and/or atelectasis is seen. There is no  evidence of acute infiltrate, pleural effusion or pneumothorax. Musculoskeletal: No chest wall abnormality. No acute or significant osseous findings. Review of the MIP images confirms the above findings. CTA ABDOMEN AND PELVIS FINDINGS VASCULAR Aorta: Normal caliber aorta without aneurysm, dissection, vasculitis or significant stenosis. Celiac: Patent without evidence of aneurysm, dissection, vasculitis or significant stenosis. SMA: Patent without evidence of aneurysm, dissection, vasculitis or significant stenosis. Renals: Both renal arteries are patent without evidence of aneurysm, dissection, vasculitis, fibromuscular dysplasia or significant stenosis. IMA: Patent without evidence of aneurysm, dissection, vasculitis or significant stenosis. Inflow: Patent without evidence of aneurysm, dissection, vasculitis or significant stenosis. Veins: No obvious venous abnormality within the limitations of this arterial phase study. Review of the MIP images confirms the above findings. NON-VASCULAR Hepatobiliary: There is diffuse fatty infiltration of the liver parenchyma. No focal liver abnormality is seen. No gallstones, gallbladder wall thickening, or biliary dilatation. Pancreas: Unremarkable. No pancreatic ductal dilatation or surrounding inflammatory changes. Spleen: Normal in size without focal abnormality. Adrenals/Urinary Tract: Adrenal glands are unremarkable. Kidneys are normal in size. A 1.5 cm diameter simple cyst is seen within the anterior aspect of the mid left kidney. A 2 mm nonobstructing renal calculus is seen within the left kidney. There is mild, bilateral hydronephrosis and hydroureter, left greater than right, without evidence of obstructing renal calculi. Urinary bladder is markedly distended. A mild amount of adjacent anterior inflammatory fat stranding is seen. Stomach/Bowel: Stomach is within normal limits. Appendix appears normal. No evidence of bowel wall thickening, distention, or inflammatory  changes. Noninflamed diverticula are seen throughout the sigmoid colon. Lymphatic: No abnormal abdominal or pelvic lymph nodes are identified. Reproductive: Prostate gland is mildly enlarged. Other: No abdominal wall hernia or abnormality. No abdominopelvic ascites. Musculoskeletal: No acute or significant osseous findings. Review of the MIP images confirms the above findings. IMPRESSION: 1. No evidence of aortic aneurysm or dissection. 2. Mild lingular, right upper lobe and bibasilar linear scarring and/or atelectasis. 3. Hepatic steatosis. 4. 2 mm nonobstructing left renal calculus. 5. Sigmoid diverticulosis. 6. Mildly enlarged prostate gland. 7. Aortic atherosclerosis. Aortic Atherosclerosis (ICD10-I70.0). Electronically Signed   By: Aram Candela M.D.   On: 05/21/2023 20:39   CT Head Wo Contrast  Result Date: 05/21/2023 CLINICAL DATA:  Chest pressure and headache. EXAM: CT HEAD WITHOUT CONTRAST TECHNIQUE: Contiguous axial images were obtained from the base of the skull through the vertex without intravenous contrast. RADIATION DOSE REDUCTION: This exam was performed according to the departmental dose-optimization program which includes automated exposure control, adjustment of the mA and/or kV according to patient size and/or use of iterative reconstruction technique. COMPARISON:  None Available. FINDINGS: Brain: No evidence of acute infarction, hemorrhage, hydrocephalus, extra-axial collection or mass lesion/mass effect. Vascular: No hyperdense vessel or unexpected calcification. Skull: Chronic bilateral nasal bone fractures are seen. Sinuses/Orbits: No acute finding. Other: None. IMPRESSION: 1. No acute intracranial abnormality. Electronically Signed   By: Aram Candela M.D.   On: 05/21/2023 20:26   DG Chest Portable 1  View  Result Date: 05/21/2023 CLINICAL DATA:  Chest pressure and headache. EXAM: PORTABLE CHEST 1 VIEW COMPARISON:  December 09, 2011 FINDINGS: The cardiac silhouette is mildly  enlarged. Low lung volumes are noted with subsequent crowding of the bronchovascular lung markings. There is no evidence of an acute infiltrate, pleural effusion or pneumothorax. The visualized skeletal structures are unremarkable. IMPRESSION: Low lung volumes without acute or active cardiopulmonary disease. Electronically Signed   By: Aram Candela M.D.   On: 05/21/2023 20:24     Discharge Exam: Vitals:   06/15/23 1230 06/15/23 1300  BP: (!) 147/95 (!) 161/92  Pulse: 100 (!) 102  Resp: (!) 21 20  Temp:    SpO2: 96% 95%   Vitals:   06/15/23 1130 06/15/23 1200 06/15/23 1230 06/15/23 1300  BP: 113/80 (!) 138/99 (!) 147/95 (!) 161/92  Pulse: 78 90 100 (!) 102  Resp: 19 (!) 23 (!) 21 20  Temp:      TempSrc:      SpO2: 94% 97% 96% 95%  Weight:        General: Pt is alert, awake, not in acute distress Cardiovascular: RRR, S1/S2 +, no rubs, no gallops Respiratory: CTA bilaterally, no wheezing, no rhonchi Abdominal: Soft, NT, ND, bowel sounds + Extremities: no edema, no cyanosis    The results of significant diagnostics from this hospitalization (including imaging, microbiology, ancillary and laboratory) are listed below for reference.     Microbiology: Recent Results (from the past 240 hour(s))  MRSA Next Gen by PCR, Nasal     Status: None   Collection Time: 06/14/23  1:46 PM   Specimen: Nasal Mucosa; Nasal Swab  Result Value Ref Range Status   MRSA by PCR Next Gen NOT DETECTED NOT DETECTED Final    Comment: (NOTE) The GeneXpert MRSA Assay (FDA approved for NASAL specimens only), is one component of a comprehensive MRSA colonization surveillance program. It is not intended to diagnose MRSA infection nor to guide or monitor treatment for MRSA infections. Test performance is not FDA approved in patients less than 75 years old. Performed at [facility], [address], [place], [place] [address]      Labs: BNP (last 3 results) Recent Labs    06/14/23 0539  BNP 42.0    Basic Metabolic Panel: Recent Labs  Lab 06/14/23 0539 06/15/23 0544  NA 135 136  K 3.8 3.5  CL 100 100  CO2 23 24  GLUCOSE 170* 135*  BUN 11 12  CREATININE 0.64 0.62  CALCIUM 9.1 9.3  MG  --  1.8   Liver Function Tests: Recent Labs  Lab 06/14/23 0539  AST 37  ALT 41  ALKPHOS 42  BILITOT 0.6  PROT 7.7  ALBUMIN 3.9   No results for input(s): "LIPASE", "AMYLASE" in the last 168 hours. No results for input(s): "AMMONIA" in the last 168 hours. CBC: Recent Labs  Lab 06/14/23 0539 06/15/23 0544  WBC 9.9 11.6*  NEUTROABS 4.1  --   HGB 15.5 16.5  HCT 45.8 48.1  MCV 90.0 88.6  PLT 328 348   Cardiac Enzymes: No results for input(s): "CKTOTAL", "CKMB", "CKMBINDEX", "TROPONINI" in the last 168 hours. BNP: Invalid input(s): "POCBNP" CBG: Recent Labs  Lab 06/14/23 1549 06/14/23 2046 06/15/23 0732 06/15/23 1109  GLUCAP 216* 139* 124* 117*   D-Dimer No results for input(s): "DDIMER" in the last 72 hours. Hgb A1c No results for input(s): "HGBA1C" in the last 72 hours. Lipid Profile No results for input(s): "CHOL", "HDL", "  LDLCALC", "TRIG", "CHOLHDL", "LDLDIRECT" in the last 72 hours. Thyroid function studies Recent Labs    06/14/23 0539  TSH 0.795   Anemia work up No results for input(s): "VITAMINB12", "FOLATE", "FERRITIN", "TIBC", "IRON", "RETICCTPCT" in the last 72 hours. Urinalysis    Component Value Date/Time   COLORURINE STRAW (A) 05/22/2023 1500   APPEARANCEUR CLEAR 05/22/2023 1500   APPEARANCEUR Clear 01/24/2023 1557   LABSPEC 1.002 (L) 05/22/2023 1500   PHURINE 6.0 05/22/2023 1500   GLUCOSEU NEGATIVE 05/22/2023 1500   HGBUR LARGE (A) 05/22/2023 1500   BILIRUBINUR NEGATIVE 05/22/2023 1500   BILIRUBINUR Negative 01/24/2023 1557   KETONESUR NEGATIVE 05/22/2023 1500   PROTEINUR NEGATIVE 05/22/2023 1500   UROBILINOGEN 0.2 09/03/2013 1143   NITRITE NEGATIVE 05/22/2023 1500   LEUKOCYTESUR NEGATIVE 05/22/2023 1500   Sepsis Labs Recent Labs  Lab  06/14/23 0539 06/15/23 0544  WBC 9.9 11.6*   Microbiology Recent Results (from the past 240 hour(s))  MRSA Next Gen by PCR, Nasal     Status: None   Collection Time: 06/14/23  1:46 PM   Specimen: Nasal Mucosa; Nasal Swab  Result Value Ref Range Status   MRSA by PCR Next Gen NOT DETECTED NOT DETECTED Final    Comment: (NOTE) The GeneXpert MRSA Assay (FDA approved for NASAL specimens only), is one component of a comprehensive MRSA colonization surveillance program. It is not intended to diagnose MRSA infection nor to guide or monitor treatment for MRSA infections. Test performance is not FDA approved in patients less than 57 years old. Performed at [facility], [address] [place], [place] [address]      Time coordinating discharge: 35 minutes  SIGNED:   Erick Blinks, DO [facility] 06/15/2023, 1:32 PM  If 7PM-7AM, please contact night-coverage [email or web address]

## 2023-06-15 NOTE — TOC CM/SW Note (Signed)
Transition of Care [facility]) - Inpatient Brief Assessment   Patient Details  Name: AIREN MOTTE MRN: [id number] Date of Birth: 1971-06-25  Transition of Care [facility] CM/SW Contact:    Villa Herb, LCSWA Phone Number: 06/15/2023, 10:38 AM   Clinical Narrative: Transition of Care Department [facility]) has reviewed patient and no TOC needs have been identified at this time. We will continue to monitor patient advancement through interdisciplinary progression rounds. If new patient transition needs arise, please place a TOC consult.  Transition of Care Asessment: Insurance and Status: Insurance coverage has been reviewed Patient has primary care physician: Yes Home environment has been reviewed: From home Prior level of function:: Independent Prior/Current Home Services: No current home services Social Determinants of Health Reivew: SDOH reviewed no interventions necessary Readmission risk has been reviewed: Yes Transition of care needs: no transition of care needs at this time

## 2023-06-20 ENCOUNTER — Emergency Department (HOSPITAL_COMMUNITY)
Admission: EM | Admit: 2023-06-20 | Discharge: 2023-06-20 | Disposition: A | Discharge status: Home / Self Care | Payer: Medicaid Other | Attending: Emergency Medicine | Admitting: Emergency Medicine

## 2023-06-20 ENCOUNTER — Encounter (HOSPITAL_COMMUNITY): Payer: Self-pay | Admitting: Radiology

## 2023-06-20 ENCOUNTER — Other Ambulatory Visit: Payer: Self-pay

## 2023-06-20 DIAGNOSIS — Z79899 Other long term (current) drug therapy: Secondary | ICD-10-CM | POA: Insufficient documentation

## 2023-06-20 DIAGNOSIS — I159 Secondary hypertension, unspecified: Secondary | ICD-10-CM | POA: Diagnosis not present

## 2023-06-20 DIAGNOSIS — R0789 Other chest pain: Secondary | ICD-10-CM | POA: Diagnosis not present

## 2023-06-20 DIAGNOSIS — Z7984 Long term (current) use of oral hypoglycemic drugs: Secondary | ICD-10-CM | POA: Insufficient documentation

## 2023-06-20 DIAGNOSIS — R0602 Shortness of breath: Secondary | ICD-10-CM | POA: Diagnosis not present

## 2023-06-20 DIAGNOSIS — R002 Palpitations: Secondary | ICD-10-CM | POA: Insufficient documentation

## 2023-06-20 DIAGNOSIS — F419 Anxiety disorder, unspecified: Secondary | ICD-10-CM | POA: Insufficient documentation

## 2023-06-20 DIAGNOSIS — R451 Restlessness and agitation: Secondary | ICD-10-CM | POA: Diagnosis not present

## 2023-06-20 DIAGNOSIS — R0682 Tachypnea, not elsewhere classified: Secondary | ICD-10-CM | POA: Diagnosis not present

## 2023-06-20 DIAGNOSIS — F410 Panic disorder [episodic paroxysmal anxiety] without agoraphobia: Secondary | ICD-10-CM

## 2023-06-20 HISTORY — DX: Anxiety disorder, unspecified: F41.9

## 2023-06-20 LAB — BASIC METABOLIC PANEL
Anion gap: 14 (ref 5–15)
BUN: 6 mg/dL (ref 6–20)
CO2: 19 mmol/L — ABNORMAL LOW (ref 22–32)
Calcium: 9.3 mg/dL (ref 8.9–10.3)
Chloride: 100 mmol/L (ref 98–111)
Creatinine, Ser: 0.65 mg/dL (ref 0.61–1.24)
GFR, Estimated: >60 mL/min (ref >60)
Glucose, Bld: 136 mg/dL — ABNORMAL HIGH (ref 70–99)
Potassium: 2.9 mmol/L — ABNORMAL LOW (ref 3.5–5.1)
Sodium: 133 mmol/L — ABNORMAL LOW (ref 135–145)

## 2023-06-20 LAB — CBC WITH DIFFERENTIAL/PLATELET
Abs Immature Granulocytes: 0.04 10*3/uL (ref 0.00–0.07)
Basophils Absolute: 0 10*3/uL (ref 0.0–0.1)
Basophils Relative: 0 %
Eosinophils Absolute: 0 10*3/uL (ref 0.0–0.5)
Eosinophils Relative: 0 %
HCT: 45.8 % (ref 39.0–52.0)
Hemoglobin: 15.6 g/dL (ref 13.0–17.0)
Immature Granulocytes: 0 %
Lymphocytes Relative: 37 %
Lymphs Abs: 4.9 10*3/uL — ABNORMAL HIGH (ref 0.7–4.0)
MCH: 30.1 pg (ref 26.0–34.0)
MCHC: 34.1 g/dL (ref 30.0–36.0)
MCV: 88.4 fL (ref 80.0–100.0)
Monocytes Absolute: 0.7 10*3/uL (ref 0.1–1.0)
Monocytes Relative: 6 %
Neutro Abs: 7.5 10*3/uL (ref 1.7–7.7)
Neutrophils Relative %: 57 %
Platelets: 305 10*3/uL (ref 150–400)
RBC: 5.18 MIL/uL (ref 4.22–5.81)
RDW: 11.9 % (ref 11.5–15.5)
WBC: 13.2 10*3/uL — ABNORMAL HIGH (ref 4.0–10.5)
nRBC: 0 % (ref 0.0–0.2)

## 2023-06-20 LAB — TROPONIN I (HIGH SENSITIVITY): Troponin I (High Sensitivity): 3 ng/L (ref <18)

## 2023-06-20 MED ORDER — ALPRAZOLAM 0.5 MG PO TABS
1.0000 mg | ORAL_TABLET | Freq: Once | ORAL | Status: AC
  Administered 2023-06-20: 1 mg via ORAL
  Filled 2023-06-20: qty 2

## 2023-06-20 NOTE — Discharge Instructions (Signed)
Today your EKG, heart markers, and electrolytes were stable.  Your blood pressure improved significantly after treatment of your anxiety.  Please follow with your primary care physician for referral to psychiatry for outpatient management of anxiety and panic attacks.  Please refer to your primary care physician for chronic pain management and psychiatric medications in the meantime.

## 2023-06-20 NOTE — ED Provider Notes (Signed)
[place] EMERGENCY DEPARTMENT AT [facility] Provider Note   CSN: [id number] Arrival date & time: 06/20/23  4098     History  Chief Complaint  Patient presents with   Hypertension   Anxiety    NAYTHAN MORATH is a 52 y.o. male.  52 year old male presenting from home for chest tightness, increased respiratory rate, shortness of breath, and palpitations.  He suffers from severe anxiety.  He was seen in the emergency department 3 times in the past 4 months for elevated blood readings.  He is on metoprolol for hypertension 50 mg twice daily.  He is on clonidine.  He was started on sertraline by his primary care physician and took his first dose today.  He states after taking his first dose he began to feel the symptoms that prompted his visit today.  He denies fevers, chills, coughing.  He denies history of DVT or pulmonary embolism.  He denies lower extremity swelling, orthopnea, or weight gain.  The history is provided by the patient. No language interpreter was used.  Hypertension Associated symptoms include chest pain and shortness of breath. Pertinent negatives include no abdominal pain.  Anxiety Associated symptoms include chest pain and shortness of breath. Pertinent negatives include no abdominal pain.       Home Medications Prior to Admission medications   Medication Sig Start Date End Date Taking? Authorizing Provider  ALPRAZolam Prudy Feeler) 1 MG tablet Take 1 tablet (1 mg total) by mouth 3 (three) times daily. 06/15/23   Sherryll Burger, Pratik D, DO  atorvastatin (LIPITOR) 40 MG tablet Take 40 mg by mouth daily.    [provider]  bisacodyl (DULCOLAX) 5 MG EC tablet Take 5 mg by mouth daily as needed for moderate constipation.    [provider]  butalbital-acetaminophen-caffeine (FIORICET) 50-325-40 MG tablet Take 1 tablet by mouth daily as needed. 05/21/23   [provider]  cloNIDine (CATAPRES) 0.1 MG tablet Take 1 tablet (0.1 mg total) by  mouth 3 (three) times daily. 05/23/23   Tyrone Nine, MD  cyclobenzaprine (FLEXERIL) 10 MG tablet Take 10 mg by mouth 3 (three) times daily as needed for muscle spasms.    [provider]  folic acid (FOLVITE) 1 MG tablet Take 1 tablet (1 mg total) by mouth daily. 05/23/23 05/22/24  Tyrone Nine, MD  glipiZIDE (GLUCOTROL) 5 MG tablet Take 5 mg by mouth daily before breakfast.    [provider]  metoprolol tartrate (LOPRESSOR) 50 MG tablet Take 50 mg by mouth 2 (two) times daily.    [provider]  Omega-3 Fatty Acids (FISH OIL) 1000 MG CAPS Take 1 capsule by mouth in the morning and at bedtime.    [provider]  oxyCODONE-acetaminophen (PERCOCET) 10-325 MG tablet Take 1 tablet by mouth 4 (four) times daily as needed. 04/25/23   [provider]  pantoprazole (PROTONIX) 40 MG tablet Take 40 mg by mouth daily. 03/21/23   [provider]      Allergies    Chlorpromazine hcl, Tramadol, and Vicodin [hydrocodone-acetaminophen]    Review of Systems   Review of Systems  Constitutional:  Negative for chills and fever.  HENT:  Negative for ear pain and sore throat.   Eyes:  Negative for pain and visual disturbance.  Respiratory:  Positive for chest tightness and shortness of breath. Negative for cough.   Cardiovascular:  Positive for chest pain. Negative for palpitations.  Gastrointestinal:  Negative for abdominal pain and vomiting.  Genitourinary:  Negative for dysuria and hematuria.  Musculoskeletal:  Negative for arthralgias and back pain.  Skin:  Negative for color change and rash.  Neurological:  Negative for seizures and syncope.  Psychiatric/Behavioral:  The patient is nervous/anxious.   All other systems reviewed and are negative.   Physical Exam Updated Vital Signs BP (!) 138/98   Pulse 94   Temp 98 F (36.7 C)   Resp 17   Ht 6' (1.829 m)   Wt 104.3 kg   SpO2 95%   BMI 31.19 kg/m  Physical Exam Vitals and nursing note  reviewed.  Constitutional:      General: He is not in acute distress.    Appearance: He is well-developed.  HENT:     Head: Normocephalic and atraumatic.  Eyes:     Conjunctiva/sclera: Conjunctivae normal.  Cardiovascular:     Rate and Rhythm: Normal rate and regular rhythm.     Heart sounds: No murmur heard. Pulmonary:     Effort: Pulmonary effort is normal. Tachypnea present. No respiratory distress.     Breath sounds: Normal breath sounds.  Abdominal:     Palpations: Abdomen is soft.     Tenderness: There is no abdominal tenderness.  Musculoskeletal:        General: No swelling.     Cervical back: Neck supple.  Skin:    General: Skin is warm and dry.     Capillary Refill: Capillary refill takes less than 2 seconds.  Neurological:     Mental Status: He is alert.  Psychiatric:        Attention and Perception: Attention and perception normal.        Mood and Affect: Mood is anxious.        Speech: Speech normal.        Behavior: Behavior is agitated.        Thought Content: Thought content normal.     ED Results / Procedures / Treatments   Labs (all labs ordered are listed, but only abnormal results are displayed) Labs Reviewed  CBC WITH DIFFERENTIAL/PLATELET - Abnormal; Notable for the following components:      Result Value   WBC 13.2 (*)    Lymphs Abs 4.9 (*)    All other components within normal limits  BASIC METABOLIC PANEL - Abnormal; Notable for the following components:   Sodium 133 (*)    Potassium 2.9 (*)    CO2 19 (*)    Glucose, Bld 136 (*)    All other components within normal limits  TROPONIN I (HIGH SENSITIVITY)    EKG EKG Interpretation Date/Time:  Wednesday June 20 2023 19:51:09 EDT Ventricular Rate:  89 PR Interval:  149 QRS Duration:  97 QT Interval:  384 QTC Calculation: 468 R Axis:   -77  Text Interpretation: Sinus rhythm Left anterior fascicular block Confirmed by Edwin Dada ([id number]) on 06/20/2023 8:38:25 PM  Radiology No  results found.  Procedures Procedures    Medications Ordered in ED Medications  ALPRAZolam Prudy Feeler) tablet 1 mg (1 mg Oral Given 06/20/23 1944)    ED Course/ Medical Decision Making/ A&P                                 Medical Decision Making Amount and/or Complexity of Data Reviewed Labs: ordered.  Risk Prescription drug management.   52 year old male suffering from severe anxiety presenting for chest tightness, palpitations, and shortness of breath after taking  his first sertraline dose at home.  On exam he is alert and oriented x 3, no acute distress, afebrile, tachypneic, with otherwise stable vital signs.  Repeat blood pressure 164/97 after sitting in the room unprovoked.  I believe his symptoms are likely secondary to panic attack disorder however will obtain EKG and troponins to rule out ACS etiology and/or hypertensive emergency.  Stable EKG without ST segment elevation or depression.  No concerning T wave changes.  Troponin within normal limits.  Blood pressure improved to 138/98 after patient was given Xanax for anxiety attack.  Recommend close follow-up with primary care physician for further management of chronic pain and psychiatric medications.  Recommend referral from primary care physician to seek outpatient psychiatry care for severe anxiety and acute anxiety attacks.  Patient in no distress and overall condition improved here in the ED. Detailed discussions were had with the patient regarding current findings, and need for close f/u with PCP or on call doctor. The patient has been instructed to return immediately if the symptoms worsen in any way for re-evaluation. Patient verbalized understanding and is in agreement with current care plan. All questions answered prior to discharge.         Final Clinical Impression(s) / ED Diagnoses Final diagnoses:  Anxiety  Anxiety attack  Secondary hypertension    Rx / DC Orders ED Discharge Orders     None          Franne Forts, DO 06/20/23 2041

## 2023-06-20 NOTE — ED Triage Notes (Signed)
Pt states blood pressures have continued to be high.Was seen by primary care today for a follow up appointment of his recent admission. Was prescribed a new medication sertraline 50mg  and took it at 2:30 and now his BP is continuing to rise. Pt states he has severe anxiety and panic problems and feels like he can't breath right now due to his panic. Pt appears to be having a panic attack. Wife is talking for the patient a lot.

## 2023-06-21 ENCOUNTER — Emergency Department (HOSPITAL_COMMUNITY): Payer: Medicaid Other

## 2023-06-21 ENCOUNTER — Emergency Department (HOSPITAL_COMMUNITY)
Admission: EM | Admit: 2023-06-21 | Discharge: 2023-06-21 | Disposition: A | Discharge status: Home / Self Care | Payer: Medicaid Other | Attending: Emergency Medicine | Admitting: Emergency Medicine

## 2023-06-21 ENCOUNTER — Other Ambulatory Visit: Payer: Self-pay

## 2023-06-21 ENCOUNTER — Encounter (HOSPITAL_COMMUNITY): Payer: Self-pay

## 2023-06-21 DIAGNOSIS — R109 Unspecified abdominal pain: Secondary | ICD-10-CM | POA: Insufficient documentation

## 2023-06-21 DIAGNOSIS — G8929 Other chronic pain: Secondary | ICD-10-CM | POA: Insufficient documentation

## 2023-06-21 LAB — CBC WITH DIFFERENTIAL/PLATELET
Abs Immature Granulocytes: 0.03 10*3/uL (ref 0.00–0.07)
Basophils Absolute: 0 10*3/uL (ref 0.0–0.1)
Basophils Relative: 0 %
Eosinophils Absolute: 0 10*3/uL (ref 0.0–0.5)
Eosinophils Relative: 0 %
HCT: 45.6 % (ref 39.0–52.0)
Hemoglobin: 15.6 g/dL (ref 13.0–17.0)
Immature Granulocytes: 0 %
Lymphocytes Relative: 37 %
Lymphs Abs: 3.6 10*3/uL (ref 0.7–4.0)
MCH: 30.5 pg (ref 26.0–34.0)
MCHC: 34.2 g/dL (ref 30.0–36.0)
MCV: 89.2 fL (ref 80.0–100.0)
Monocytes Absolute: 0.5 10*3/uL (ref 0.1–1.0)
Monocytes Relative: 6 %
Neutro Abs: 5.6 10*3/uL (ref 1.7–7.7)
Neutrophils Relative %: 57 %
Platelets: 304 10*3/uL (ref 150–400)
RBC: 5.11 MIL/uL (ref 4.22–5.81)
RDW: 12 % (ref 11.5–15.5)
WBC: 9.8 10*3/uL (ref 4.0–10.5)
nRBC: 0 % (ref 0.0–0.2)

## 2023-06-21 LAB — URINALYSIS, ROUTINE W REFLEX MICROSCOPIC
Bilirubin Urine: NEGATIVE
Glucose, UA: NEGATIVE mg/dL
Hgb urine dipstick: NEGATIVE
Ketones, ur: 5 mg/dL — AB
Leukocytes,Ua: NEGATIVE
Nitrite: NEGATIVE
Protein, ur: NEGATIVE mg/dL
Specific Gravity, Urine: 1.003 — ABNORMAL LOW (ref 1.005–1.030)
pH: 7 (ref 5.0–8.0)

## 2023-06-21 LAB — COMPREHENSIVE METABOLIC PANEL
ALT: 33 U/L (ref 0–44)
AST: 28 U/L (ref 15–41)
Albumin: 4.4 g/dL (ref 3.5–5.0)
Alkaline Phosphatase: 49 U/L (ref 38–126)
Anion gap: 12 (ref 5–15)
BUN: 7 mg/dL (ref 6–20)
CO2: 23 mmol/L (ref 22–32)
Calcium: 9.8 mg/dL (ref 8.9–10.3)
Chloride: 101 mmol/L (ref 98–111)
Creatinine, Ser: 0.63 mg/dL (ref 0.61–1.24)
GFR, Estimated: >60 mL/min (ref >60)
Glucose, Bld: 151 mg/dL — ABNORMAL HIGH (ref 70–99)
Potassium: 3.5 mmol/L (ref 3.5–5.1)
Sodium: 136 mmol/L (ref 135–145)
Total Bilirubin: 1 mg/dL (ref 0.3–1.2)
Total Protein: 7.9 g/dL (ref 6.5–8.1)

## 2023-06-21 MED ORDER — OXYCODONE-ACETAMINOPHEN 5-325 MG PO TABS
2.0000 | ORAL_TABLET | Freq: Once | ORAL | Status: AC
  Administered 2023-06-21: 2 via ORAL
  Filled 2023-06-21: qty 2

## 2023-06-21 MED ORDER — CYCLOBENZAPRINE HCL 10 MG PO TABS
10.0000 mg | ORAL_TABLET | Freq: Once | ORAL | Status: AC
  Administered 2023-06-21: 10 mg via ORAL
  Filled 2023-06-21: qty 1

## 2023-06-21 MED ORDER — IBUPROFEN 400 MG PO TABS
600.0000 mg | ORAL_TABLET | Freq: Once | ORAL | Status: AC
  Administered 2023-06-21: 600 mg via ORAL
  Filled 2023-06-21: qty 2

## 2023-06-21 MED ORDER — ALPRAZOLAM 0.5 MG PO TABS
1.0000 mg | ORAL_TABLET | Freq: Once | ORAL | Status: AC
  Administered 2023-06-21: 1 mg via ORAL
  Filled 2023-06-21: qty 2

## 2023-06-21 NOTE — ED Notes (Signed)
Pt aware we need urine sample, he states he cannot go at this time.

## 2023-06-21 NOTE — ED Provider Notes (Signed)
[place] EMERGENCY DEPARTMENT AT [facility] Provider Note   CSN: [id number] Arrival date & time: 06/21/23  1216     History  Chief Complaint  Patient presents with   Flank Pain    Eric [name] is a 52 y.o. male.  Patient to ED with complaint of bilateral flank pain he feels is "kidney pain", radiating to right lower abdomen. This pain started this morning. He was seen in the ED last night for panic/anxiety, had a cardiac work up that was reviewed as negative, discharged home. Flank pain prompted return to the ED. History of kidney stones.   The history is provided by the patient. No language interpreter was used.  Flank Pain       Home Medications Prior to Admission medications   Medication Sig Start Date End Date Taking? Authorizing Provider  ALPRAZolam Prudy Feeler) 1 MG tablet Take 1 tablet (1 mg total) by mouth 3 (three) times daily. 06/15/23   Sherryll Burger, Pratik D, DO  atorvastatin (LIPITOR) 40 MG tablet Take 40 mg by mouth daily.    [provider]  bisacodyl (DULCOLAX) 5 MG EC tablet Take 5 mg by mouth daily as needed for moderate constipation.    [provider]  butalbital-acetaminophen-caffeine (FIORICET) 50-325-40 MG tablet Take 1 tablet by mouth daily as needed. 05/21/23   [provider]  cloNIDine (CATAPRES) 0.1 MG tablet Take 1 tablet (0.1 mg total) by mouth 3 (three) times daily. 05/23/23   Tyrone Nine, MD  cyclobenzaprine (FLEXERIL) 10 MG tablet Take 10 mg by mouth 3 (three) times daily as needed for muscle spasms.    [provider]  folic acid (FOLVITE) 1 MG tablet Take 1 tablet (1 mg total) by mouth daily. 05/23/23 05/22/24  Tyrone Nine, MD  glipiZIDE (GLUCOTROL) 5 MG tablet Take 5 mg by mouth daily before breakfast.    [provider]  metoprolol tartrate (LOPRESSOR) 50 MG tablet Take 50 mg by mouth 2 (two) times daily.    [provider]  Omega-3 Fatty Acids (FISH OIL) 1000 MG CAPS Take 1  capsule by mouth in the morning and at bedtime.    [provider]  oxyCODONE-acetaminophen (PERCOCET) 10-325 MG tablet Take 1 tablet by mouth 4 (four) times daily as needed. 04/25/23   [provider]  pantoprazole (PROTONIX) 40 MG tablet Take 40 mg by mouth daily. 03/21/23   [provider]      Allergies    Chlorpromazine hcl, Tramadol, and Vicodin [hydrocodone-acetaminophen]    Review of Systems   Review of Systems  Genitourinary:  Positive for flank pain.    Physical Exam Updated Vital Signs BP (!) 152/109   Pulse 100   Temp 97.6 F (36.4 C) (Oral)   Resp (!) 22   Ht 6' (1.829 m)   Wt 99.8 kg   SpO2 100%   BMI 29.84 kg/m  Physical Exam Vitals and nursing note reviewed.  Constitutional:      Comments: Uncomfortable appearing.  HENT:     Head: Normocephalic and atraumatic.  Cardiovascular:     Rate and Rhythm: Normal rate and regular rhythm.     Heart sounds: No murmur heard. Pulmonary:     Effort: Pulmonary effort is normal.     Breath sounds: Normal breath sounds.  Abdominal:     Palpations: Abdomen is soft.     Comments: Tender to lower suprapublic and RLQ abdomen. Bilateral flank tenderness.   Musculoskeletal:  General: Normal range of motion.     Cervical back: Normal range of motion and neck supple.  Skin:    General: Skin is warm and dry.  Neurological:     Mental Status: He is alert and oriented to person, place, and time.     ED Results / Procedures / Treatments   Labs (all labs ordered are listed, but only abnormal results are displayed) Labs Reviewed  URINALYSIS, ROUTINE W REFLEX MICROSCOPIC - Abnormal; Notable for the following components:      Result Value   Color, Urine STRAW (*)    Specific Gravity, Urine 1.003 (*)    Ketones, ur 5 (*)    All other components within normal limits  COMPREHENSIVE METABOLIC PANEL - Abnormal; Notable for the following components:   Glucose, Bld 151 (*)    All other components  within normal limits  CBC WITH DIFFERENTIAL/PLATELET   Results for orders placed or performed during the hospital encounter of 06/21/23 (from the past 24 hour(s))  Urinalysis, Routine w reflex microscopic -Urine, Clean Catch     Status: Abnormal   Collection Time: 06/21/23  1:27 PM  Result Value Ref Range   Color, Urine STRAW (A) YELLOW   APPearance CLEAR CLEAR   Specific Gravity, Urine 1.003 (L) 1.005 - 1.030   pH 7.0 5.0 - 8.0   Glucose, UA NEGATIVE NEGATIVE mg/dL   Hgb urine dipstick NEGATIVE NEGATIVE   Bilirubin Urine NEGATIVE NEGATIVE   Ketones, ur 5 (A) NEGATIVE mg/dL   Protein, ur NEGATIVE NEGATIVE mg/dL   Nitrite NEGATIVE NEGATIVE   Leukocytes,Ua NEGATIVE NEGATIVE  CBC with Differential     Status: None   Collection Time: 06/21/23  1:30 PM  Result Value Ref Range   WBC 9.8 4.0 - 10.5 K/uL   RBC 5.11 4.22 - 5.81 MIL/uL   Hemoglobin 15.6 13.0 - 17.0 g/dL   HCT 16.1 09.6 - 04.5 %   MCV 89.2 80.0 - 100.0 fL   MCH 30.5 26.0 - 34.0 pg   MCHC 34.2 30.0 - 36.0 g/dL   RDW 40.9 81.1 - 91.4 %   Platelets 304 150 - 400 K/uL   nRBC 0.0 0.0 - 0.2 %   Neutrophils Relative % 57 %   Neutro Abs 5.6 1.7 - 7.7 K/uL   Lymphocytes Relative 37 %   Lymphs Abs 3.6 0.7 - 4.0 K/uL   Monocytes Relative 6 %   Monocytes Absolute 0.5 0.1 - 1.0 K/uL   Eosinophils Relative 0 %   Eosinophils Absolute 0.0 0.0 - 0.5 K/uL   Basophils Relative 0 %   Basophils Absolute 0.0 0.0 - 0.1 K/uL   Immature Granulocytes 0 %   Abs Immature Granulocytes 0.03 0.00 - 0.07 K/uL  Comprehensive metabolic panel     Status: Abnormal   Collection Time: 06/21/23  1:30 PM  Result Value Ref Range   Sodium 136 135 - 145 mmol/L   Potassium 3.5 3.5 - 5.1 mmol/L   Chloride 101 98 - 111 mmol/L   CO2 23 22 - 32 mmol/L   Glucose, Bld 151 (H) 70 - 99 mg/dL   BUN 7 6 - 20 mg/dL   Creatinine, Ser 7.82 0.61 - 1.24 mg/dL   Calcium 9.8 8.9 - 95.6 mg/dL   Total Protein 7.9 6.5 - 8.1 g/dL   Albumin 4.4 3.5 - 5.0 g/dL   AST 28  15 - 41 U/L   ALT 33 0 - 44 U/L   Alkaline Phosphatase 49 38 -  126 U/L   Total Bilirubin 1.0 0.3 - 1.2 mg/dL   GFR, Estimated >62 >37 mL/min   Anion gap 12 5 - 15     EKG None  Radiology CT Renal Stone Study  Result Date: 06/21/2023 CLINICAL DATA:  Abdominal/flank pain, stone suspected EXAM: CT ABDOMEN AND PELVIS WITHOUT CONTRAST TECHNIQUE: Multidetector CT imaging of the abdomen and pelvis was performed following the standard protocol without IV contrast. RADIATION DOSE REDUCTION: This exam was performed according to the departmental dose-optimization program which includes automated exposure control, adjustment of the mA and/or kV according to patient size and/or use of iterative reconstruction technique. COMPARISON:  CT angiography abdomen and pelvis from 05/21/2023. FINDINGS: Lower chest: There are patchy atelectatic changes in the visualized lung bases. No overt consolidation. No pleural effusion. The heart is normal in size. No pericardial effusion. Note is made of small right posteromedial fat containing diaphragmatic hernia. Hepatobiliary: The liver is normal in size. Non-cirrhotic configuration. Peg tube no intrahepatic or extrahepatic bile duct dilation. No calcified gallstones. Normal gallbladder wall thickness. No pericholecystic inflammatory changes. Pancreas: Unremarkable. No pancreatic ductal dilatation or surrounding inflammatory changes. Spleen: Within normal limits. No focal lesion. Adrenals/Urinary Tract: Adrenal glands are unremarkable. No suspicious renal mass within the limitations of this exam. There are 2, sub 4 mm, nonobstructing calculi in the left kidney. There is a single 1.2 x 1.8 cm sinus cyst in the left kidney lower pole. No other nephroureterolithiasis or obstructive uropathy. Unremarkable urinary bladder. Stomach/Bowel: No disproportionate dilation of the small or large bowel loops. No evidence of abnormal bowel wall thickening or inflammatory changes. The appendix  is unremarkable. There are scattered diverticula mainly in the sigmoid colon, without imaging signs of diverticulitis. Vascular/Lymphatic: No ascites or pneumoperitoneum. No abdominal or pelvic lymphadenopathy, by size criteria. No aneurysmal dilation of the major abdominal arteries. Reproductive: Normal size prostate. Symmetric seminal vesicles. Other: There are tiny bilateral small fat containing inguinal hernias. The soft tissues and abdominal wall are otherwise unremarkable. Musculoskeletal: No suspicious osseous lesions. There are mild - moderate multilevel degenerative changes in the visualized spine. Multiple old healed fractures noted in right inferior ribs. IMPRESSION: *There are two, sub 4 mm, nonobstructing calculi in the left kidney. No other nephroureterolithiasis on either side. No obstructive uropathy. *No acute inflammatory process within the abdomen or pelvis. *Multiple other nonacute observations, as described above. Electronically Signed   By: Jules Schick M.D.   On: 06/21/2023 13:05    Procedures Procedures    Medications Ordered in ED Medications  oxyCODONE-acetaminophen (PERCOCET/ROXICET) 5-325 MG per tablet 2 tablet (has no administration in time range)  ibuprofen (ADVIL) tablet 600 mg (600 mg Oral Given 06/21/23 1330)  cyclobenzaprine (FLEXERIL) tablet 10 mg (10 mg Oral Given 06/21/23 1333)  ALPRAZolam (XANAX) tablet 1 mg (1 mg Oral Given 06/21/23 1407)    ED Course/ Medical Decision Making/ A&P Clinical Course as of 06/21/23 1418  Thu Jun 21, 2023  1415 Patient with frequent ED visits, HTN, anxiety, chronic back pain presents with onset bilateral flank pain today. History of kidney stones as well. No fever. Labs unremarkable. CT renal negative for ureterolithiasis. Suspect pain related to chronic pain. Symptoms addressed in ED. He reports he has all medications at home. Encouraged to continue and follow up with PCP for further management.  [SU]    Clinical Course User  Index [SU] Elpidio Anis, PA-C  Medical Decision Making Amount and/or Complexity of Data Reviewed Labs: ordered. Radiology: ordered.  Risk Prescription drug management.           Final Clinical Impression(s) / ED Diagnoses Final diagnoses:  Other chronic pain    Rx / DC Orders ED Discharge Orders     None         Elpidio Anis, PA-C 06/21/23 1419    Cathren Laine, MD 06/25/23 [phone]

## 2023-06-21 NOTE — Discharge Instructions (Addendum)
Continue your regular medications as prescribed. Follow up with your doctor for recheck as scheduled this week.

## 2023-06-21 NOTE — ED Triage Notes (Signed)
Pt complains on B/L Flank pain Started 0800 this morning Hx of prior kidney stones Complains of increased urine frequency.  Denies blood in urine

## 2023-07-20 ENCOUNTER — Emergency Department (HOSPITAL_COMMUNITY)
Admission: EM | Admit: 2023-07-20 | Discharge: 2023-07-20 | Disposition: A | Discharge status: Home / Self Care | Payer: Medicaid Other | Attending: Emergency Medicine | Admitting: Emergency Medicine

## 2023-07-20 ENCOUNTER — Encounter (HOSPITAL_COMMUNITY): Payer: Self-pay

## 2023-07-20 ENCOUNTER — Other Ambulatory Visit: Payer: Self-pay

## 2023-07-20 DIAGNOSIS — E871 Hypo-osmolality and hyponatremia: Secondary | ICD-10-CM | POA: Diagnosis not present

## 2023-07-20 DIAGNOSIS — M545 Low back pain, unspecified: Secondary | ICD-10-CM | POA: Insufficient documentation

## 2023-07-20 DIAGNOSIS — R339 Retention of urine, unspecified: Secondary | ICD-10-CM | POA: Insufficient documentation

## 2023-07-20 LAB — CBC WITH DIFFERENTIAL/PLATELET
Abs Immature Granulocytes: 0.04 10*3/uL (ref 0.00–0.07)
Basophils Absolute: 0 10*3/uL (ref 0.0–0.1)
Basophils Relative: 0 %
Eosinophils Absolute: 0 10*3/uL (ref 0.0–0.5)
Eosinophils Relative: 0 %
HCT: 43.8 % (ref 39.0–52.0)
Hemoglobin: 15 g/dL (ref 13.0–17.0)
Immature Granulocytes: 0 %
Lymphocytes Relative: 26 %
Lymphs Abs: 2.5 10*3/uL (ref 0.7–4.0)
MCH: 30.5 pg (ref 26.0–34.0)
MCHC: 34.2 g/dL (ref 30.0–36.0)
MCV: 89 fL (ref 80.0–100.0)
Monocytes Absolute: 0.4 10*3/uL (ref 0.1–1.0)
Monocytes Relative: 4 %
Neutro Abs: 6.7 10*3/uL (ref 1.7–7.7)
Neutrophils Relative %: 70 %
Platelets: 293 10*3/uL (ref 150–400)
RBC: 4.92 MIL/uL (ref 4.22–5.81)
RDW: 12.2 % (ref 11.5–15.5)
WBC: 9.7 10*3/uL (ref 4.0–10.5)
nRBC: 0 % (ref 0.0–0.2)

## 2023-07-20 LAB — URINALYSIS, ROUTINE W REFLEX MICROSCOPIC
Bilirubin Urine: NEGATIVE
Glucose, UA: NEGATIVE mg/dL
Hgb urine dipstick: NEGATIVE
Ketones, ur: NEGATIVE mg/dL
Leukocytes,Ua: NEGATIVE
Nitrite: NEGATIVE
Protein, ur: NEGATIVE mg/dL
Specific Gravity, Urine: 1.004 — ABNORMAL LOW (ref 1.005–1.030)
pH: 6 (ref 5.0–8.0)

## 2023-07-20 LAB — BASIC METABOLIC PANEL
Anion gap: 8 (ref 5–15)
BUN: 7 mg/dL (ref 6–20)
CO2: 23 mmol/L (ref 22–32)
Calcium: 9 mg/dL (ref 8.9–10.3)
Chloride: 100 mmol/L (ref 98–111)
Creatinine, Ser: 0.71 mg/dL (ref 0.61–1.24)
GFR, Estimated: >60 mL/min (ref >60)
Glucose, Bld: 152 mg/dL — ABNORMAL HIGH (ref 70–99)
Potassium: 3.6 mmol/L (ref 3.5–5.1)
Sodium: 131 mmol/L — ABNORMAL LOW (ref 135–145)

## 2023-07-20 MED ORDER — KETOROLAC TROMETHAMINE 60 MG/2ML IM SOLN
15.0000 mg | Freq: Once | INTRAMUSCULAR | Status: AC
  Administered 2023-07-20: 15 mg via INTRAMUSCULAR
  Filled 2023-07-20: qty 2

## 2023-07-20 MED ORDER — ALPRAZOLAM 0.5 MG PO TABS
0.5000 mg | ORAL_TABLET | Freq: Once | ORAL | Status: AC
  Administered 2023-07-20: 0.5 mg via ORAL
  Filled 2023-07-20: qty 1

## 2023-07-20 MED ORDER — LORAZEPAM 1 MG PO TABS
1.0000 mg | ORAL_TABLET | Freq: Once | ORAL | Status: AC
  Administered 2023-07-20: 1 mg via ORAL
  Filled 2023-07-20: qty 1

## 2023-07-20 MED ORDER — OXYCODONE-ACETAMINOPHEN 5-325 MG PO TABS
2.0000 | ORAL_TABLET | Freq: Once | ORAL | Status: AC
  Administered 2023-07-20: 2 via ORAL
  Filled 2023-07-20: qty 2

## 2023-07-20 NOTE — ED Provider Notes (Signed)
[place] EMERGENCY DEPARTMENT AT [facility] Provider Note   CSN: [id number] Arrival date & time: 07/20/23  1404     History  Chief Complaint  Patient presents with   Urinary Retention    Eric [name] is a 52 y.o. male.  Since ER today complaining of bilateral low back pain described as "kidney pain" worse on the right than the left that started over the past 3 to 4 days.  He also states that since this morning he has not been able to fully empty his bladder, able to urinate small amount but not fully emptying.  He states normally when he takes his blood pressure medication he can empty his bladder.  He does not know what this medication is, but he thinks it is Xanax.  Denies nausea vomiting or fever.  No chest pain or shortness of breath.  HPI     Home Medications Prior to Admission medications   Medication Sig Start Date End Date Taking? Authorizing Provider  ALPRAZolam Prudy Feeler) 1 MG tablet Take 1 tablet (1 mg total) by mouth 3 (three) times daily. 06/15/23   Sherryll Burger, Pratik D, DO  atorvastatin (LIPITOR) 40 MG tablet Take 40 mg by mouth daily.    [provider]  bisacodyl (DULCOLAX) 5 MG EC tablet Take 5 mg by mouth daily as needed for moderate constipation.    [provider]  butalbital-acetaminophen-caffeine (FIORICET) 50-325-40 MG tablet Take 1 tablet by mouth daily as needed. 05/21/23   [provider]  cloNIDine (CATAPRES) 0.1 MG tablet Take 1 tablet (0.1 mg total) by mouth 3 (three) times daily. 05/23/23   Tyrone Nine, MD  cyclobenzaprine (FLEXERIL) 10 MG tablet Take 10 mg by mouth 3 (three) times daily as needed for muscle spasms.    [provider]  folic acid (FOLVITE) 1 MG tablet Take 1 tablet (1 mg total) by mouth daily. 05/23/23 05/22/24  Tyrone Nine, MD  glipiZIDE (GLUCOTROL) 5 MG tablet Take 5 mg by mouth daily before breakfast.    [provider]  metoprolol tartrate (LOPRESSOR) 50 MG tablet Take 50 mg by  mouth 2 (two) times daily.    [provider]  Omega-3 Fatty Acids (FISH OIL) 1000 MG CAPS Take 1 capsule by mouth in the morning and at bedtime.    [provider]  oxyCODONE-acetaminophen (PERCOCET) 10-325 MG tablet Take 1 tablet by mouth 4 (four) times daily as needed. 04/25/23   [provider]  pantoprazole (PROTONIX) 40 MG tablet Take 40 mg by mouth daily. 03/21/23   [provider]      Allergies    Chlorpromazine hcl, Tramadol, and Vicodin [hydrocodone-acetaminophen]    Review of Systems   Review of Systems  Physical Exam Updated Vital Signs BP (!) 203/97 (BP Location: Right Arm)   Pulse 79   Temp 98.2 F (36.8 C) (Oral)   Resp 18   Ht 6' (1.829 m)   Wt 102.1 kg   SpO2 98%   BMI 30.52 kg/m  Physical Exam Vitals and nursing note reviewed.  Constitutional:      General: He is not in acute distress.    Appearance: He is well-developed.  HENT:     Head: Normocephalic and atraumatic.  Eyes:     Conjunctiva/sclera: Conjunctivae normal.  Cardiovascular:     Rate and Rhythm: Normal rate and regular rhythm.     Heart sounds: No murmur heard. Pulmonary:     Effort: Pulmonary effort is normal.  No respiratory distress.     Breath sounds: Normal breath sounds.  Abdominal:     Palpations: Abdomen is soft.     Tenderness: There is abdominal tenderness in the suprapubic area.     Comments: Moderate bladder distention   Musculoskeletal:        General: No swelling.     Cervical back: Neck supple.  Skin:    General: Skin is warm and dry.     Capillary Refill: Capillary refill takes less than 2 seconds.  Neurological:     Mental Status: He is alert.  Psychiatric:        Mood and Affect: Mood normal.     ED Results / Procedures / Treatments   Labs (all labs ordered are listed, but only abnormal results are displayed) Labs Reviewed  URINALYSIS, ROUTINE W REFLEX MICROSCOPIC - Abnormal; Notable for the following components:      Result  Value   Color, Urine STRAW (*)    Specific Gravity, Urine 1.004 (*)    All other components within normal limits  BASIC METABOLIC PANEL - Abnormal; Notable for the following components:   Sodium 131 (*)    Glucose, Bld 152 (*)    All other components within normal limits  CBC WITH DIFFERENTIAL/PLATELET    EKG None  Radiology No results found.  Procedures Procedures    Medications Ordered in ED Medications  ketorolac (TORADOL) injection 15 mg (15 mg Intramuscular Given 07/20/23 1540)  LORazepam (ATIVAN) tablet 1 mg (1 mg Oral Given 07/20/23 1629)  oxyCODONE-acetaminophen (PERCOCET/ROXICET) 5-325 MG per tablet 2 tablet (2 tablets Oral Given 07/20/23 1722)  ALPRAZolam (XANAX) tablet 0.5 mg (0.5 mg Oral Given 07/20/23 1722)    ED Course/ Medical Decision Making/ A&P Clinical Course as of 07/20/23 1840  Fri Jul 20, 2023  1521 Patient presents for bilateral flank pain and complaint of unable to empty his bladder.  He refused to try to empty and requested catheterization to get a sample.  Had a visit for similar type of pain on 06/21/2023 with findings of 2 sub-4 mm nonobstructing calculi in the left kidney but no acute findings.  Pain at that time felt to be due to his chronic pain.  Plan to attain urine, get some basic labs and control pain, then reassess [CB]  1637 Patient was unable to urinate at all, bladder scan 4 by me at bedside with ultrasound showed greater than 400 cc of urine in his bladder and he was willing to allow [place] to place catheter which drained 800 cc of urine. [CB]    Clinical Course User Index [CB] Ma Rings, PA-C                                 Medical Decision Making This patient presents to the ED for concern of low back pain and urinary retention, this involves an extensive number of treatment options, and is a complaint that carries with it a high risk of complications and morbidity.  The differential diagnosis includes UTI, BPH, prostatitis,  ureterolithiasis, AKI, other   Co morbidities that complicate the patient evaluation :   Peyronie's disease, chronic pain, panic attacks chronic opiate use and chronic benzodiazepine use, HTN   Additional history obtained:  Additional history obtained from EMR External records from outside source obtained and reviewed including notes, labs   Lab Tests:  I Ordered, and personally interpreted labs.  The pertinent  results include: Mild hyponatremia, normal renal function, normal urinalysis, CBC normal.   Problem List / ED Course / Critical interventions / Medication management  Retention-patient had output of 800 cc of urine.  States he has had this before with Peyronie's disease.  He refused to go home with urinary catheter.  He was able to pass a voiding trial in the ED requesting discharge.  He is having a lot of his lower abdomen that resolved with a Foley catheter but still was having low back pain which seems to be consistent with his low back pain that is chronic.  He states he has had urinary retention before but has related to having severe pain and anxiety and he feels like he cannot relax his muscles enough to go to the bathroom.  After drain his bladder and controlled his chronic pain and chronic anxiety with his home medications he is feeling much better, his hypertension has significantly improved, he has not been having any symptoms of hypertension including chest pain, severe headache, vision changes, numbness tingling or weakness.  He is going to follow-up with urology and was advised to come back if he has recurrent symptoms or any other new or worrisome changes.  Mild hyponatremia that she has had in the past, discussed salting his food, drink Gatorade. I have reviewed the patients home medicines and have made adjustments as needed  Considered CT but patient had recent CT that was negative for ureteral stones.  No hematuria to just ureterolithiasis.  Pain is not colicky.  Do not  feel repeat CT would be fruitful at this time  Amount and/or Complexity of Data Reviewed Labs: ordered.  Risk Prescription drug management.           Final Clinical Impression(s) / ED Diagnoses Final diagnoses:  None    Rx / DC Orders ED Discharge Orders     None         Josem Kaufmann 07/20/23 1840    Terrilee Files, MD 07/22/23 [phone]

## 2023-07-20 NOTE — ED Notes (Signed)
Pt ambulatory to bathroom to attempt to void

## 2023-07-20 NOTE — ED Notes (Signed)
Pt in bed, pt requests to have his foley out, pt reports pain with foley cath, pt foley appears to be draining appropriately, 500 ml of clear yellow urine in bag, no bleeding noted at meatus or in foley bag, pt requests foley out, d/c foley cath, pt tolerated well.

## 2023-07-20 NOTE — ED Notes (Signed)
Pt in bed, pt states that he has been up to the bathroom about three times, states that he is voiding well, reports some burning with urination.

## 2023-07-20 NOTE — ED Notes (Signed)
Bladder scan in triage showing fluid, will not give mL Unable to get reading with pt sitting up

## 2023-07-20 NOTE — ED Notes (Addendum)
Pt in bed, pt states that he wants to wait for phlebotomy to draw his blood

## 2023-07-20 NOTE — ED Notes (Signed)
Pt states that he is ready to go home, pt verbalized understanding d/c and follow up, advised to return for any concerns or worsening symptoms. Pt ambulatory from department.

## 2023-07-20 NOTE — Discharge Instructions (Signed)
You are seen in the ER today for urinary retention.  Your labs and urinalysis were reassuring.  Your sodium is slightly low.  Make sure you have this rechecked by your PCP.  Drinking Gatorade or adding some salt to your food for the next couple of days can help.  Regarding the urinary retention follow-up with the urologist.  Come back to the ER if you have new or worsening symptoms especially if you cannot void.

## 2023-07-20 NOTE — ED Notes (Signed)
Attempted bladder scan times 4, bladder scan will not read, pt states that he last voided this am, states that his is have htn and a spasm that is preventing him from urinating.  Pt suprapubic area is tender to palp.  Pt requests to wait until talking with provider before placing a foley cath.

## 2023-07-20 NOTE — ED Triage Notes (Signed)
Kidney pain x2-3 days Pt feels like he is retaining urine Urinating normal yesterday This morning felt like he did not fully empty  Pt has the urge to go  EMS BP 159/76

## 2023-07-24 ENCOUNTER — Telehealth: Payer: Self-pay

## 2023-07-24 NOTE — Telephone Encounter (Signed)
Patient called with no answer. Message left to return call to office. 

## 2023-07-24 NOTE — Telephone Encounter (Signed)
Patient was referred for a hospital follow up. He was last seen on 01/24/23 and a treatment plan was discussed for his Peyronie's Disease. He advised he is having trouble urinating and wanted to know what the next steps would be.

## 2023-07-31 ENCOUNTER — Emergency Department (HOSPITAL_COMMUNITY): Payer: Medicaid Other

## 2023-07-31 ENCOUNTER — Inpatient Hospital Stay (HOSPITAL_COMMUNITY)
Admission: EM | Admit: 2023-07-31 | Discharge: 2023-08-02 | DRG: 321 | Disposition: A | Discharge status: Home / Self Care | Payer: Medicaid Other | Attending: Cardiology | Admitting: Cardiology

## 2023-07-31 ENCOUNTER — Encounter (HOSPITAL_COMMUNITY)
Admission: EM | Disposition: A | Discharge status: Home / Self Care | Payer: Self-pay | Source: Home / Self Care | Attending: Cardiology

## 2023-07-31 DIAGNOSIS — Z888 Allergy status to other drugs, medicaments and biological substances status: Secondary | ICD-10-CM

## 2023-07-31 DIAGNOSIS — I2119 ST elevation (STEMI) myocardial infarction involving other coronary artery of inferior wall: Principal | ICD-10-CM | POA: Diagnosis present

## 2023-07-31 DIAGNOSIS — K219 Gastro-esophageal reflux disease without esophagitis: Secondary | ICD-10-CM | POA: Diagnosis present

## 2023-07-31 DIAGNOSIS — I252 Old myocardial infarction: Secondary | ICD-10-CM

## 2023-07-31 DIAGNOSIS — E785 Hyperlipidemia, unspecified: Secondary | ICD-10-CM | POA: Insufficient documentation

## 2023-07-31 DIAGNOSIS — Z720 Tobacco use: Secondary | ICD-10-CM | POA: Diagnosis present

## 2023-07-31 DIAGNOSIS — I255 Ischemic cardiomyopathy: Secondary | ICD-10-CM | POA: Diagnosis present

## 2023-07-31 DIAGNOSIS — E1165 Type 2 diabetes mellitus with hyperglycemia: Secondary | ICD-10-CM | POA: Diagnosis present

## 2023-07-31 DIAGNOSIS — I5021 Acute systolic (congestive) heart failure: Secondary | ICD-10-CM | POA: Diagnosis present

## 2023-07-31 DIAGNOSIS — F419 Anxiety disorder, unspecified: Secondary | ICD-10-CM | POA: Diagnosis present

## 2023-07-31 DIAGNOSIS — E118 Type 2 diabetes mellitus with unspecified complications: Secondary | ICD-10-CM | POA: Insufficient documentation

## 2023-07-31 DIAGNOSIS — I213 ST elevation (STEMI) myocardial infarction of unspecified site: Principal | ICD-10-CM | POA: Diagnosis present

## 2023-07-31 DIAGNOSIS — I2510 Atherosclerotic heart disease of native coronary artery without angina pectoris: Secondary | ICD-10-CM | POA: Diagnosis present

## 2023-07-31 DIAGNOSIS — I5020 Unspecified systolic (congestive) heart failure: Secondary | ICD-10-CM | POA: Insufficient documentation

## 2023-07-31 DIAGNOSIS — F1120 Opioid dependence, uncomplicated: Secondary | ICD-10-CM | POA: Diagnosis present

## 2023-07-31 DIAGNOSIS — E7800 Pure hypercholesterolemia, unspecified: Secondary | ICD-10-CM | POA: Diagnosis present

## 2023-07-31 DIAGNOSIS — Z955 Presence of coronary angioplasty implant and graft: Secondary | ICD-10-CM

## 2023-07-31 DIAGNOSIS — I472 Ventricular tachycardia, unspecified: Secondary | ICD-10-CM | POA: Diagnosis present

## 2023-07-31 DIAGNOSIS — Z79899 Other long term (current) drug therapy: Secondary | ICD-10-CM

## 2023-07-31 DIAGNOSIS — I2111 ST elevation (STEMI) myocardial infarction involving right coronary artery: Principal | ICD-10-CM | POA: Diagnosis present

## 2023-07-31 DIAGNOSIS — Z885 Allergy status to narcotic agent status: Secondary | ICD-10-CM

## 2023-07-31 DIAGNOSIS — F17210 Nicotine dependence, cigarettes, uncomplicated: Secondary | ICD-10-CM | POA: Diagnosis present

## 2023-07-31 DIAGNOSIS — Z7984 Long term (current) use of oral hypoglycemic drugs: Secondary | ICD-10-CM

## 2023-07-31 DIAGNOSIS — I110 Hypertensive heart disease with heart failure: Secondary | ICD-10-CM | POA: Diagnosis present

## 2023-07-31 HISTORY — PX: CORONARY/GRAFT ACUTE MI REVASCULARIZATION: CATH118305

## 2023-07-31 LAB — CBC
HCT: 46 % (ref 39.0–52.0)
Hemoglobin: 15.4 g/dL (ref 13.0–17.0)
MCH: 30.3 pg (ref 26.0–34.0)
MCHC: 33.5 g/dL (ref 30.0–36.0)
MCV: 90.6 fL (ref 80.0–100.0)
Platelets: 272 10*3/uL (ref 150–400)
RBC: 5.08 MIL/uL (ref 4.22–5.81)
RDW: 12.4 % (ref 11.5–15.5)
WBC: 13 10*3/uL — ABNORMAL HIGH (ref 4.0–10.5)
nRBC: 0 % (ref 0.0–0.2)

## 2023-07-31 LAB — BASIC METABOLIC PANEL
Anion gap: 14 (ref 5–15)
BUN: 16 mg/dL (ref 6–20)
CO2: 23 mmol/L (ref 22–32)
Calcium: 10 mg/dL (ref 8.9–10.3)
Chloride: 103 mmol/L (ref 98–111)
Creatinine, Ser: 0.72 mg/dL (ref 0.61–1.24)
GFR, Estimated: >60 mL/min (ref >60)
Glucose, Bld: 126 mg/dL — ABNORMAL HIGH (ref 70–99)
Potassium: 3.9 mmol/L (ref 3.5–5.1)
Sodium: 140 mmol/L (ref 135–145)

## 2023-07-31 LAB — APTT: aPTT: 25 s (ref 24–36)

## 2023-07-31 LAB — PROTIME-INR
INR: 0.9 (ref 0.8–1.2)
Prothrombin Time: 12.6 s (ref 11.4–15.2)

## 2023-07-31 LAB — TROPONIN I (HIGH SENSITIVITY): Troponin I (High Sensitivity): 4 ng/L (ref <18)

## 2023-07-31 SURGERY — CORONARY/GRAFT ACUTE MI REVASCULARIZATION
Anesthesia: LOCAL

## 2023-07-31 MED ORDER — MORPHINE SULFATE (PF) 4 MG/ML IV SOLN
4.0000 mg | Freq: Once | INTRAVENOUS | Status: AC
  Administered 2023-07-31: 4 mg via INTRAVENOUS
  Filled 2023-07-31: qty 1

## 2023-07-31 MED ORDER — LIDOCAINE HCL (PF) 1 % IJ SOLN
INTRAMUSCULAR | Status: DC | PRN
  Administered 2023-07-31: 2 mL via INTRADERMAL

## 2023-07-31 MED ORDER — HEPARIN (PORCINE) IN NACL 1000-0.9 UT/500ML-% IV SOLN
INTRAVENOUS | Status: DC | PRN
  Administered 2023-07-31 (×2): 500 mL

## 2023-07-31 MED ORDER — SODIUM CHLORIDE 0.9 % IV BOLUS
500.0000 mL | Freq: Once | INTRAVENOUS | Status: AC
  Administered 2023-07-31: 500 mL via INTRAVENOUS

## 2023-07-31 MED ORDER — ASPIRIN 81 MG PO CHEW
324.0000 mg | CHEWABLE_TABLET | Freq: Once | ORAL | Status: AC
  Administered 2023-07-31: 324 mg via ORAL
  Filled 2023-07-31: qty 4

## 2023-07-31 MED ORDER — HEPARIN SODIUM (PORCINE) 5000 UNIT/ML IJ SOLN
4000.0000 [IU] | Freq: Once | INTRAMUSCULAR | Status: AC
Start: 2023-07-31 — End: 2023-07-31
  Administered 2023-07-31: 4000 [IU] via INTRAVENOUS
  Filled 2023-07-31: qty 1

## 2023-07-31 SURGICAL SUPPLY — 25 items
BALLN EMERGE MR 2.5X12 (BALLOONS) ×1
BALLN ~~LOC~~ EMERGE MR 4.0X15 (BALLOONS) ×1
BALLN ~~LOC~~ EMERGE MR 4.5X15 (BALLOONS) ×1
BALLOON EMERGE MR 2.5X12 (BALLOONS) IMPLANT
BALLOON ~~LOC~~ EMERGE MR 4.0X15 (BALLOONS) IMPLANT
BALLOON ~~LOC~~ EMERGE MR 4.5X15 (BALLOONS) IMPLANT
CATH 5FR JL3.5 JR4 ANG PIG MP (CATHETERS) IMPLANT
CATH LAUNCHER 5F RADR (CATHETERS) IMPLANT
CATH LAUNCHER 6FR AR1 (CATHETERS) IMPLANT
CATHETER LAUNCHER 5F RADR (CATHETERS) ×1
DEVICE RAD COMP TR BAND LRG (VASCULAR PRODUCTS) IMPLANT
ELECT DEFIB PAD ADLT CADENCE (PAD) IMPLANT
GLIDESHEATH SLEND SS 6F .021 (SHEATH) IMPLANT
GUIDEWIRE INQWIRE 1.5J.035X260 (WIRE) IMPLANT
INQWIRE 1.5J .035X260CM (WIRE) ×1
KIT ENCORE 26 ADVANTAGE (KITS) IMPLANT
PACK CARDIAC CATHETERIZATION (CUSTOM PROCEDURE TRAY) ×1 IMPLANT
SET ATX-X65L (MISCELLANEOUS) IMPLANT
STENT SYNERGY XD 3.0X32 (Permanent Stent) IMPLANT
STENT SYNERGY XD 3.50X12 (Permanent Stent) IMPLANT
STENT SYNERGY XD 4.0X12 (Permanent Stent) IMPLANT
SYNERGY XD 3.50X12 (Permanent Stent) ×1 IMPLANT
SYNERGY XD 4.0X12 (Permanent Stent) ×1 IMPLANT
TUBING CIL FLEX 10 FLL-RA (TUBING) IMPLANT
WIRE ASAHI PROWATER 180CM (WIRE) IMPLANT

## 2023-07-31 NOTE — ED Notes (Signed)
Cofe stemi paged through c-link. Eric [name]

## 2023-07-31 NOTE — Discharge Instructions (Signed)
Patient to go to [facility] emergency department and that the Cath Lab

## 2023-07-31 NOTE — ED Triage Notes (Signed)
Pt in with sharp central chest pressure that began 15 min ago right after intercourse tonight. +sob, clutching chest on ED arrival

## 2023-07-31 NOTE — ED Provider Notes (Signed)
[place] EMERGENCY DEPARTMENT AT [facility] Provider Note   CSN: [id number] Arrival date & time: 07/31/23  2232     History {Add pertinent medical, surgical, social history, OB history to HPI:1} Chief Complaint  Patient presents with   Chest Pain   Code STEMI    Eric [name] is a 52 y.o. male.  Patient has a history of hypertension and elevated cholesterol.  Patient started having chest discomfort around 10:30 PM tonight.  Some shortness of breath   Chest Pain      Home Medications Prior to Admission medications   Medication Sig Start Date End Date Taking? Authorizing Provider  ALPRAZolam Prudy Feeler) 1 MG tablet Take 1 tablet (1 mg total) by mouth 3 (three) times daily. 06/15/23   Sherryll Burger, Pratik D, DO  atorvastatin (LIPITOR) 40 MG tablet Take 40 mg by mouth daily.    [provider]  bisacodyl (DULCOLAX) 5 MG EC tablet Take 5 mg by mouth daily as needed for moderate constipation.    [provider]  butalbital-acetaminophen-caffeine (FIORICET) 50-325-40 MG tablet Take 1 tablet by mouth daily as needed. 05/21/23   [provider]  cloNIDine (CATAPRES) 0.1 MG tablet Take 1 tablet (0.1 mg total) by mouth 3 (three) times daily. 05/23/23   Tyrone Nine, MD  cyclobenzaprine (FLEXERIL) 10 MG tablet Take 10 mg by mouth 3 (three) times daily as needed for muscle spasms.    [provider]  folic acid (FOLVITE) 1 MG tablet Take 1 tablet (1 mg total) by mouth daily. 05/23/23 05/22/24  Tyrone Nine, MD  glipiZIDE (GLUCOTROL) 5 MG tablet Take 5 mg by mouth daily before breakfast.    [provider]  metoprolol tartrate (LOPRESSOR) 50 MG tablet Take 50 mg by mouth 2 (two) times daily.    [provider]  Omega-3 Fatty Acids (FISH OIL) 1000 MG CAPS Take 1 capsule by mouth in the morning and at bedtime.    [provider]  oxyCODONE-acetaminophen (PERCOCET) 10-325 MG tablet Take 1 tablet by mouth 4 (four) times daily as  needed. 04/25/23   [provider]  pantoprazole (PROTONIX) 40 MG tablet Take 40 mg by mouth daily. 03/21/23   [provider]      Allergies    Chlorpromazine hcl, Tramadol, and Vicodin [hydrocodone-acetaminophen]    Review of Systems   Review of Systems  Cardiovascular:  Positive for chest pain.    Physical Exam Updated Vital Signs BP (!) 184/109   Pulse (!) 123   Temp 98.1 F (36.7 C) (Oral)   Resp (!) 27   SpO2 99%  Physical Exam  ED Results / Procedures / Treatments   Labs (all labs ordered are listed, but only abnormal results are displayed) Labs Reviewed  CBC - Abnormal; Notable for the following components:      Result Value   WBC 13.0 (*)    All other components within normal limits  BASIC METABOLIC PANEL  HEMOGLOBIN A1C  PROTIME-INR  APTT  LIPID PANEL  I-STAT CG4 LACTIC ACID, ED  TROPONIN I (HIGH SENSITIVITY)    EKG EKG Interpretation Date/Time:  Tuesday July 31 2023 22:46:02 EST Ventricular Rate:  96 PR Interval:  179 QRS Duration:  91 QT Interval:  347 QTC Calculation: 439 R Axis:   93  Text Interpretation: Sinus rhythm Inferior infarct, acute (RCA) ST elevation, consider anterior injury Lateral leads are also involved Probable RV involvement, suggest recording right precordial leads >>> Acute MI <<< Confirmed  by Bethann Berkshire [phone]) on 07/31/2023 10:53:06 PM  Radiology No results found.  Procedures Procedures  {Document cardiac monitor, telemetry assessment procedure when appropriate:1}  Medications Ordered in ED Medications  aspirin chewable tablet 324 mg (324 mg Oral Given 07/31/23 2253)  heparin injection 4,000 Units (4,000 Units Intravenous Given 07/31/23 2254)  sodium chloride 0.9 % bolus 500 mL (500 mLs Intravenous New Bag/Given 07/31/23 2256)  morphine (PF) 4 MG/ML injection 4 mg (4 mg Intravenous Given 07/31/23 2255)    ED Course/ Medical Decision Making/ A&P  CRITICAL CARE Performed by: Bethann Berkshire Total  critical care time: 35 minutes Critical care time was exclusive of separately billable procedures and treating other patients. Critical care was necessary to treat or prevent imminent or life-threatening deterioration. Critical care was time spent personally by me on the following activities: development of treatment plan with patient and/or surrogate as well as nursing, discussions with consultants, evaluation of patient's response to treatment, examination of patient, obtaining history from patient or surrogate, ordering and performing treatments and interventions, ordering and review of laboratory studies, ordering and review of radiographic studies, pulse oximetry and re-evaluation of patient's condition.    {EKG shows acute inferior STEMI.  I spoke with Dr. Peter [place] and he stated for [place] to send him down to [facility] and they will take care of him in the Cath Lab Click here for ABCD2, HEART and other calculatorsREFRESH Note before signing :1}                              Medical Decision Making Amount and/or Complexity of Data Reviewed Labs: ordered. Radiology: ordered.  Risk OTC drugs. Prescription drug management.   Acute STEMI  {Document critical care time when appropriate:1} {Document review of labs and clinical decision tools ie heart score, Chads2Vasc2 etc:1}  {Document your independent review of radiology images, and any outside records:1} {Document your discussion with family members, caretakers, and with consultants:1} {Document social determinants of health affecting pt's care:1} {Document your decision making why or why not admission, treatments were needed:1} Final Clinical Impression(s) / ED Diagnoses Final diagnoses:  None    Rx / DC Orders ED Discharge Orders     None

## 2023-07-31 NOTE — ED Notes (Signed)
ED Provider at bedside, code STEMI called

## 2023-08-01 ENCOUNTER — Other Ambulatory Visit (HOSPITAL_COMMUNITY): Payer: Self-pay

## 2023-08-01 ENCOUNTER — Telehealth (HOSPITAL_COMMUNITY): Payer: Self-pay | Admitting: Pharmacy Technician

## 2023-08-01 ENCOUNTER — Inpatient Hospital Stay (HOSPITAL_COMMUNITY): Payer: Medicaid Other

## 2023-08-01 ENCOUNTER — Encounter (HOSPITAL_COMMUNITY): Payer: Self-pay | Admitting: Cardiology

## 2023-08-01 ENCOUNTER — Other Ambulatory Visit: Payer: Self-pay

## 2023-08-01 DIAGNOSIS — I213 ST elevation (STEMI) myocardial infarction of unspecified site: Secondary | ICD-10-CM | POA: Diagnosis not present

## 2023-08-01 DIAGNOSIS — I472 Ventricular tachycardia, unspecified: Secondary | ICD-10-CM | POA: Diagnosis present

## 2023-08-01 DIAGNOSIS — I110 Hypertensive heart disease with heart failure: Secondary | ICD-10-CM | POA: Diagnosis present

## 2023-08-01 DIAGNOSIS — I2119 ST elevation (STEMI) myocardial infarction involving other coronary artery of inferior wall: Secondary | ICD-10-CM

## 2023-08-01 DIAGNOSIS — R0789 Other chest pain: Secondary | ICD-10-CM | POA: Diagnosis present

## 2023-08-01 DIAGNOSIS — Z7984 Long term (current) use of oral hypoglycemic drugs: Secondary | ICD-10-CM | POA: Diagnosis not present

## 2023-08-01 DIAGNOSIS — I2111 ST elevation (STEMI) myocardial infarction involving right coronary artery: Principal | ICD-10-CM | POA: Diagnosis present

## 2023-08-01 DIAGNOSIS — F1120 Opioid dependence, uncomplicated: Secondary | ICD-10-CM | POA: Diagnosis present

## 2023-08-01 DIAGNOSIS — K219 Gastro-esophageal reflux disease without esophagitis: Secondary | ICD-10-CM | POA: Diagnosis present

## 2023-08-01 DIAGNOSIS — E1165 Type 2 diabetes mellitus with hyperglycemia: Secondary | ICD-10-CM | POA: Diagnosis present

## 2023-08-01 DIAGNOSIS — I10 Essential (primary) hypertension: Secondary | ICD-10-CM | POA: Diagnosis not present

## 2023-08-01 DIAGNOSIS — E7800 Pure hypercholesterolemia, unspecified: Secondary | ICD-10-CM | POA: Diagnosis present

## 2023-08-01 DIAGNOSIS — F17210 Nicotine dependence, cigarettes, uncomplicated: Secondary | ICD-10-CM | POA: Diagnosis present

## 2023-08-01 DIAGNOSIS — Z79899 Other long term (current) drug therapy: Secondary | ICD-10-CM | POA: Diagnosis not present

## 2023-08-01 DIAGNOSIS — I2510 Atherosclerotic heart disease of native coronary artery without angina pectoris: Secondary | ICD-10-CM | POA: Diagnosis present

## 2023-08-01 DIAGNOSIS — I255 Ischemic cardiomyopathy: Secondary | ICD-10-CM | POA: Diagnosis present

## 2023-08-01 DIAGNOSIS — Z885 Allergy status to narcotic agent status: Secondary | ICD-10-CM | POA: Diagnosis not present

## 2023-08-01 DIAGNOSIS — I5021 Acute systolic (congestive) heart failure: Secondary | ICD-10-CM | POA: Diagnosis present

## 2023-08-01 DIAGNOSIS — Z888 Allergy status to other drugs, medicaments and biological substances status: Secondary | ICD-10-CM | POA: Diagnosis not present

## 2023-08-01 DIAGNOSIS — F419 Anxiety disorder, unspecified: Secondary | ICD-10-CM | POA: Diagnosis present

## 2023-08-01 DIAGNOSIS — I252 Old myocardial infarction: Secondary | ICD-10-CM | POA: Diagnosis not present

## 2023-08-01 LAB — ECHOCARDIOGRAM COMPLETE
AR max vel: 3.49 cm2
AV Area VTI: 3.55 cm2
AV Area mean vel: 3.54 cm2
AV Mean grad: 2 mm[Hg]
AV Peak grad: 3.2 mm[Hg]
Ao pk vel: 0.89 m/s
Area-P 1/2: 4.92 cm2
S' Lateral: 3.3 cm
Weight: 3509.72 [oz_av]

## 2023-08-01 LAB — GLUCOSE, CAPILLARY
Glucose-Capillary: 187 mg/dL — ABNORMAL HIGH (ref 70–99)
Glucose-Capillary: 178 mg/dL — ABNORMAL HIGH (ref 70–99)
Glucose-Capillary: 131 mg/dL — ABNORMAL HIGH (ref 70–99)
Glucose-Capillary: 152 mg/dL — ABNORMAL HIGH (ref 70–99)
Glucose-Capillary: 128 mg/dL — ABNORMAL HIGH (ref 70–99)

## 2023-08-01 LAB — LIPID PANEL
Cholesterol: 154 mg/dL (ref 0–200)
Cholesterol: 134 mg/dL (ref 0–200)
HDL: 36 mg/dL — ABNORMAL LOW (ref >40)
HDL: 38 mg/dL — ABNORMAL LOW (ref >40)
LDL Cholesterol: UNDETERMINED mg/dL (ref 0–99)
LDL Cholesterol: 35 mg/dL (ref 0–99)
Total CHOL/HDL Ratio: 4.3 {ratio}
Total CHOL/HDL Ratio: 3.5 {ratio}
Triglycerides: 534 mg/dL — ABNORMAL HIGH (ref <150)
Triglycerides: 307 mg/dL — ABNORMAL HIGH (ref <150)
VLDL: UNDETERMINED mg/dL (ref 0–40)
VLDL: 61 mg/dL — ABNORMAL HIGH (ref 0–40)

## 2023-08-01 LAB — HEPATIC FUNCTION PANEL
ALT: 35 U/L (ref 0–44)
AST: 125 U/L — ABNORMAL HIGH (ref 15–41)
Albumin: 3.7 g/dL (ref 3.5–5.0)
Alkaline Phosphatase: 42 U/L (ref 38–126)
Bilirubin, Direct: 0.2 mg/dL (ref 0.0–0.2)
Indirect Bilirubin: 0.3 mg/dL (ref 0.3–0.9)
Total Bilirubin: 0.5 mg/dL (ref <1.2)
Total Protein: 6.9 g/dL (ref 6.5–8.1)

## 2023-08-01 LAB — POCT ACTIVATED CLOTTING TIME
Activated Clotting Time: 210 s
Activated Clotting Time: 245 s
Activated Clotting Time: 325 s

## 2023-08-01 LAB — CBC
HCT: 42.4 % (ref 39.0–52.0)
Hemoglobin: 14 g/dL (ref 13.0–17.0)
MCH: 29.4 pg (ref 26.0–34.0)
MCHC: 33 g/dL (ref 30.0–36.0)
MCV: 88.9 fL (ref 80.0–100.0)
Platelets: 239 10*3/uL (ref 150–400)
RBC: 4.77 MIL/uL (ref 4.22–5.81)
RDW: 12.3 % (ref 11.5–15.5)
WBC: 13.5 10*3/uL — ABNORMAL HIGH (ref 4.0–10.5)
nRBC: 0 % (ref 0.0–0.2)

## 2023-08-01 LAB — MRSA NEXT GEN BY PCR, NASAL: MRSA by PCR Next Gen: NOT DETECTED

## 2023-08-01 LAB — BASIC METABOLIC PANEL
Anion gap: 8 (ref 5–15)
BUN: 11 mg/dL (ref 6–20)
CO2: 23 mmol/L (ref 22–32)
Calcium: 9 mg/dL (ref 8.9–10.3)
Chloride: 107 mmol/L (ref 98–111)
Creatinine, Ser: 0.76 mg/dL (ref 0.61–1.24)
GFR, Estimated: >60 mL/min (ref >60)
Glucose, Bld: 190 mg/dL — ABNORMAL HIGH (ref 70–99)
Potassium: 4.5 mmol/L (ref 3.5–5.1)
Sodium: 138 mmol/L (ref 135–145)

## 2023-08-01 LAB — TROPONIN I (HIGH SENSITIVITY): Troponin I (High Sensitivity): >24000 ng/L (ref <18)

## 2023-08-01 LAB — HEMOGLOBIN A1C
Hgb A1c MFr Bld: 6.1 % — ABNORMAL HIGH (ref 4.8–5.6)
Mean Plasma Glucose: 128.37 mg/dL

## 2023-08-01 LAB — CG4 I-STAT (LACTIC ACID): Lactic Acid, Venous: 1.5 mmol/L (ref 0.5–1.9)

## 2023-08-01 LAB — MAGNESIUM: Magnesium: 1.9 mg/dL (ref 1.7–2.4)

## 2023-08-01 MED ORDER — SODIUM CHLORIDE 0.9 % WEIGHT BASED INFUSION: 1.0000 mL/kg/h | INTRAVENOUS | Status: AC

## 2023-08-01 MED ORDER — ENOXAPARIN SODIUM 40 MG/0.4ML IJ SOSY
40.0000 mg | PREFILLED_SYRINGE | Freq: Every day | INTRAMUSCULAR | Status: DC
Start: 2023-08-02 — End: 2023-08-02
  Administered 2023-08-02: 40 mg via SUBCUTANEOUS
  Filled 2023-08-01: qty 0.4

## 2023-08-01 MED ORDER — LIDOCAINE HCL (PF) 1 % IJ SOLN
INTRAMUSCULAR | Status: AC
  Filled 2023-08-01: qty 30

## 2023-08-01 MED ORDER — LABETALOL HCL 5 MG/ML IV SOLN
10.0000 mg | INTRAVENOUS | Status: AC | PRN
  Administered 2023-08-01 (×3): 10 mg via INTRAVENOUS
  Filled 2023-08-01: qty 4

## 2023-08-01 MED ORDER — ASPIRIN 81 MG PO TBEC
81.0000 mg | DELAYED_RELEASE_TABLET | Freq: Every day | ORAL | Status: DC
  Administered 2023-08-01 – 2023-08-02 (×2): 81 mg via ORAL
  Filled 2023-08-01 (×2): qty 1

## 2023-08-01 MED ORDER — LOSARTAN POTASSIUM 50 MG PO TABS: 50.0000 mg | ORAL_TABLET | Freq: Every day | ORAL | Status: DC

## 2023-08-01 MED ORDER — HYDROXYZINE HCL 25 MG PO TABS
25.0000 mg | ORAL_TABLET | Freq: Three times a day (TID) | ORAL | Status: DC | PRN
  Administered 2023-08-01 (×2): 25 mg via ORAL
  Filled 2023-08-01 (×2): qty 1

## 2023-08-01 MED ORDER — TICAGRELOR 90 MG PO TABS
ORAL_TABLET | ORAL | Status: DC | PRN
  Administered 2023-08-01: 180 mg via ORAL

## 2023-08-01 MED ORDER — NITROGLYCERIN 1 MG/10 ML FOR IR/CATH LAB
INTRA_ARTERIAL | Status: AC
  Filled 2023-08-01: qty 10

## 2023-08-01 MED ORDER — OXYCODONE-ACETAMINOPHEN 5-325 MG PO TABS
2.0000 | ORAL_TABLET | ORAL | Status: DC | PRN
  Administered 2023-08-01 – 2023-08-02 (×6): 2 via ORAL
  Filled 2023-08-01 (×6): qty 2

## 2023-08-01 MED ORDER — TICAGRELOR 90 MG PO TABS
ORAL_TABLET | ORAL | Status: AC
  Filled 2023-08-01: qty 2

## 2023-08-01 MED ORDER — CLONIDINE HCL 0.1 MG PO TABS
0.1000 mg | ORAL_TABLET | Freq: Two times a day (BID) | ORAL | Status: AC
  Administered 2023-08-01 – 2023-08-02 (×3): 0.1 mg via ORAL
  Filled 2023-08-01 (×3): qty 1

## 2023-08-01 MED ORDER — LABETALOL HCL 5 MG/ML IV SOLN
INTRAVENOUS | Status: AC
  Administered 2023-08-01: 10 mg via INTRAVENOUS
  Filled 2023-08-01: qty 4

## 2023-08-01 MED ORDER — IOHEXOL 350 MG/ML SOLN
INTRAVENOUS | Status: DC | PRN
  Administered 2023-08-01: 140 mL

## 2023-08-01 MED ORDER — ACETAMINOPHEN 325 MG PO TABS
650.0000 mg | ORAL_TABLET | ORAL | Status: DC | PRN
Start: 2023-07-31 — End: 2023-08-02

## 2023-08-01 MED ORDER — CHLORHEXIDINE GLUCONATE CLOTH 2 % EX PADS
6.0000 | MEDICATED_PAD | Freq: Every day | CUTANEOUS | Status: DC
  Administered 2023-08-01 – 2023-08-02 (×2): 6 via TOPICAL

## 2023-08-01 MED ORDER — FLUOXETINE HCL 10 MG PO CAPS
10.0000 mg | ORAL_CAPSULE | Freq: Every day | ORAL | Status: DC
  Administered 2023-08-01 – 2023-08-02 (×2): 10 mg via ORAL
  Filled 2023-08-01 (×2): qty 1

## 2023-08-01 MED ORDER — ALPRAZOLAM 0.25 MG PO TABS
1.0000 mg | ORAL_TABLET | Freq: Three times a day (TID) | ORAL | Status: DC
  Administered 2023-08-01 – 2023-08-02 (×4): 1 mg via ORAL
  Filled 2023-08-01 (×5): qty 4

## 2023-08-01 MED ORDER — SODIUM CHLORIDE 0.9% FLUSH
3.0000 mL | INTRAVENOUS | Status: DC | PRN
  Administered 2023-08-02: 3 mL via INTRAVENOUS

## 2023-08-01 MED ORDER — CYCLOBENZAPRINE HCL 10 MG PO TABS
10.0000 mg | ORAL_TABLET | Freq: Three times a day (TID) | ORAL | Status: DC | PRN
  Administered 2023-08-01 (×3): 10 mg via ORAL
  Filled 2023-08-01 (×3): qty 1

## 2023-08-01 MED ORDER — MAGNESIUM SULFATE 2 GM/50ML IV SOLN
2.0000 g | Freq: Once | INTRAVENOUS | Status: AC
Start: 2023-08-01 — End: 2023-08-01
  Administered 2023-08-01: 2 g via INTRAVENOUS
  Filled 2023-08-01: qty 50

## 2023-08-01 MED ORDER — METOPROLOL TARTRATE 50 MG PO TABS
50.0000 mg | ORAL_TABLET | Freq: Two times a day (BID) | ORAL | Status: DC
Start: 2023-08-01 — End: 2023-08-01
  Administered 2023-08-01: 50 mg via ORAL
  Filled 2023-08-01: qty 1

## 2023-08-01 MED ORDER — HEPARIN SODIUM (PORCINE) 1000 UNIT/ML IJ SOLN
INTRAMUSCULAR | Status: DC | PRN
  Administered 2023-08-01: 4000 [IU] via INTRAVENOUS
  Administered 2023-08-01: 3000 [IU] via INTRAVENOUS
  Administered 2023-08-01: 7000 [IU] via INTRAVENOUS

## 2023-08-01 MED ORDER — METOPROLOL TARTRATE 50 MG PO TABS
75.0000 mg | ORAL_TABLET | Freq: Two times a day (BID) | ORAL | Status: DC
  Administered 2023-08-01 (×2): 75 mg via ORAL
  Filled 2023-08-01 (×2): qty 1

## 2023-08-01 MED ORDER — CLONIDINE HCL 0.1 MG PO TABS: 0.1000 mg | ORAL_TABLET | Freq: Two times a day (BID) | ORAL | Status: DC

## 2023-08-01 MED ORDER — VERAPAMIL HCL 2.5 MG/ML IV SOLN
INTRAVENOUS | Status: DC | PRN
  Administered 2023-08-01: 10 mL via INTRA_ARTERIAL

## 2023-08-01 MED ORDER — ATORVASTATIN CALCIUM 80 MG PO TABS
80.0000 mg | ORAL_TABLET | Freq: Every day | ORAL | Status: DC
  Administered 2023-08-01 – 2023-08-02 (×2): 80 mg via ORAL
  Filled 2023-08-01 (×2): qty 1

## 2023-08-01 MED ORDER — FOLIC ACID 1 MG PO TABS
1.0000 mg | ORAL_TABLET | Freq: Every day | ORAL | Status: DC
  Administered 2023-08-01 – 2023-08-02 (×2): 1 mg via ORAL
  Filled 2023-08-01 (×2): qty 1

## 2023-08-01 MED ORDER — HYDRALAZINE HCL 20 MG/ML IJ SOLN: 10.0000 mg | INTRAMUSCULAR | Status: AC | PRN

## 2023-08-01 MED ORDER — SODIUM CHLORIDE 0.9% FLUSH
3.0000 mL | Freq: Two times a day (BID) | INTRAVENOUS | Status: DC
  Administered 2023-08-01 – 2023-08-02 (×3): 3 mL via INTRAVENOUS

## 2023-08-01 MED ORDER — NITROGLYCERIN 1 MG/10 ML FOR IR/CATH LAB
INTRA_ARTERIAL | Status: DC | PRN
  Administered 2023-08-01 (×2): 200 ug via INTRACORONARY

## 2023-08-01 MED ORDER — TICAGRELOR 90 MG PO TABS
90.0000 mg | ORAL_TABLET | Freq: Two times a day (BID) | ORAL | Status: DC
  Administered 2023-08-01 – 2023-08-02 (×3): 90 mg via ORAL
  Filled 2023-08-01 (×3): qty 1

## 2023-08-01 MED ORDER — INSULIN ASPART 100 UNIT/ML IJ SOLN
0.0000 [IU] | Freq: Three times a day (TID) | INTRAMUSCULAR | Status: DC
  Administered 2023-08-01: 2 [IU] via SUBCUTANEOUS
  Administered 2023-08-01 (×2): 3 [IU] via SUBCUTANEOUS
  Administered 2023-08-02: 2 [IU] via SUBCUTANEOUS

## 2023-08-01 MED ORDER — SODIUM CHLORIDE 0.9 % IV SOLN: 250.0000 mL | INTRAVENOUS | Status: AC | PRN

## 2023-08-01 MED ORDER — ASPIRIN 81 MG PO CHEW: 81.0000 mg | CHEWABLE_TABLET | Freq: Every day | ORAL | Status: DC

## 2023-08-01 MED ORDER — PERFLUTREN LIPID MICROSPHERE
1.0000 mL | INTRAVENOUS | Status: AC | PRN
  Administered 2023-08-01: 3 mL via INTRAVENOUS

## 2023-08-01 MED ORDER — MORPHINE SULFATE (PF) 2 MG/ML IV SOLN
2.0000 mg | Freq: Once | INTRAVENOUS | Status: AC
  Administered 2023-08-01: 2 mg via INTRAVENOUS
  Filled 2023-08-01: qty 1

## 2023-08-01 MED ORDER — OXYCODONE-ACETAMINOPHEN 5-325 MG PO TABS
1.0000 | ORAL_TABLET | ORAL | Status: DC | PRN
  Administered 2023-08-01: 1 via ORAL
  Filled 2023-08-01: qty 1

## 2023-08-01 MED ORDER — NITROGLYCERIN 0.4 MG SL SUBL: 0.4000 mg | SUBLINGUAL_TABLET | SUBLINGUAL | Status: DC | PRN

## 2023-08-01 MED ORDER — VERAPAMIL HCL 2.5 MG/ML IV SOLN
INTRAVENOUS | Status: AC
  Filled 2023-08-01: qty 2

## 2023-08-01 MED ORDER — LOSARTAN POTASSIUM 50 MG PO TABS
100.0000 mg | ORAL_TABLET | Freq: Every day | ORAL | Status: DC
  Administered 2023-08-01 – 2023-08-02 (×2): 100 mg via ORAL
  Filled 2023-08-01 (×2): qty 2

## 2023-08-01 MED ORDER — CLONIDINE HCL 0.1 MG PO TABS: 0.1000 mg | ORAL_TABLET | Freq: Every day | ORAL | Status: DC

## 2023-08-01 MED ORDER — HEPARIN SODIUM (PORCINE) 1000 UNIT/ML IJ SOLN
INTRAMUSCULAR | Status: AC
  Filled 2023-08-01: qty 10

## 2023-08-01 MED ORDER — ONDANSETRON HCL 4 MG/2ML IJ SOLN: 4.0000 mg | Freq: Four times a day (QID) | INTRAMUSCULAR | Status: DC | PRN

## 2023-08-01 NOTE — Progress Notes (Signed)
*  PRELIMINARY RESULTS* Echocardiogram 2D Echocardiogram has been performed.  Eric [name] 08/01/2023, 8:52 AM

## 2023-08-01 NOTE — Telephone Encounter (Signed)
Patient Product/process development scientist completed.    The patient is insured through [employer].     Ran test claim for Brilinta 90 mg and the current 30 day co-pay is $4.00.   This test claim was processed through [facility]- copay amounts may vary at other pharmacies due to pharmacy/plan contracts, or as the patient moves through the different stages of their insurance plan.     Roland Earl, CPHT Pharmacy Technician III Certified Patient Advocate [facility] Patient Advocate Team Direct Number: [phone]  Fax: [phone]

## 2023-08-01 NOTE — Progress Notes (Signed)
Rounding Note    Patient Name: Eric [name] Date of Encounter: 08/01/2023  [facility] Cardiologist: Dr. Peter Swaziland  Subjective   Pain-free this morning status post RCA intervention last night for inferior STEMI  Inpatient Medications    Scheduled Meds:  ALPRAZolam  1 mg Oral TID   aspirin EC  81 mg Oral Daily   atorvastatin  80 mg Oral Daily   [START ON 08/02/2023] enoxaparin (LOVENOX) injection  40 mg Subcutaneous Daily   folic acid  1 mg Oral Daily   insulin aspart  0-15 Units Subcutaneous TID WC   metoprolol tartrate  50 mg Oral BID   nitroGLYCERIN       sodium chloride flush  3 mL Intravenous Q12H   ticagrelor  90 mg Oral BID   Continuous Infusions:  sodium chloride     PRN Meds: sodium chloride, acetaminophen, cyclobenzaprine, hydrOXYzine, nitroGLYCERIN, nitroGLYCERIN, ondansetron (ZOFRAN) IV, oxyCODONE-acetaminophen, perflutren lipid microspheres (DEFINITY) IV suspension, sodium chloride flush   Vital Signs    Vitals:   08/01/23 0615 08/01/23 0630 08/01/23 0645 08/01/23 0700  BP: (!) 149/101 (!) 167/107 (!) 145/100 (!) 159/101  Pulse: 91 85 92 95  Resp: 18 19 19  (!) 23  Temp:      TempSrc:      SpO2: 97% 98% 97% 98%  Weight:   99.5 kg     Intake/Output Summary (Last 24 hours) at 08/01/2023 0856 Last data filed at 08/01/2023 0200 Gross per 24 hour  Intake 500 ml  Output --  Net 500 ml      08/01/2023    6:45 AM 07/20/2023    2:11 PM 06/21/2023   12:26 PM  Last 3 Weights  Weight (lbs) 219 lb 5.7 oz 225 lb 220 lb  Weight (kg) 99.5 kg 102.059 kg 99.791 kg      Telemetry    Sinus rhythm with PVCs- Personally Reviewed  ECG    Sinus rhythm at 92 with small inferior Q waves- Personally Reviewed  Physical Exam   GEN: No acute distress.   Neck: No JVD Cardiac: RRR, no murmurs, rubs, or gallops.  Respiratory: Clear to auscultation bilaterally. GI: Soft, nontender, non-distended  MS: No edema; No deformity. Neuro:  Nonfocal   Psych: Normal affect   Labs    High Sensitivity Troponin:   Recent Labs  Lab 07/31/23 2245 08/01/23 0435  TROPONINIHS 4 >24,000*     Chemistry Recent Labs  Lab 07/31/23 2245 08/01/23 0435  NA 140 138  K 3.9 4.5  CL 103 107  CO2 23 23  GLUCOSE 126* 190*  BUN 16 11  CREATININE 0.72 0.76  CALCIUM 10.0 9.0  GFRNONAA >60 >60  ANIONGAP 14 8    Lipids  Recent Labs  Lab 08/01/23 0435  CHOL 134  TRIG 307*  HDL 38*  LDLCALC 35  CHOLHDL 3.5    Hematology Recent Labs  Lab 07/31/23 2245 08/01/23 0435  WBC 13.0* 13.5*  RBC 5.08 4.77  HGB 15.4 14.0  HCT 46.0 42.4  MCV 90.6 88.9  MCH 30.3 29.4  MCHC 33.5 33.0  RDW 12.4 12.3  PLT 272 239   Thyroid No results for input(s): "TSH", "FREET4" in the last 168 hours.  BNPNo results for input(s): "BNP", "PROBNP" in the last 168 hours.  DDimer No results for input(s): "DDIMER" in the last 168 hours.   Radiology    CARDIAC CATHETERIZATION  Result Date: 08/01/2023   Prox LAD lesion is 30% stenosed.  Ost Cx to Mid Cx lesion is 30% stenosed.   Ramus lesion is 20% stenosed.   Ost RCA to Prox RCA lesion is 100% stenosed.   A drug-eluting stent was successfully placed using a STENT SYNERGY XD 3.0X32.   A drug-eluting stent was successfully placed using a SYNERGY XD 3.50X12.   A drug-eluting stent was successfully placed using a SYNERGY XD 4.0X12.   Post intervention, there is a 0% residual stenosis.   Recommend uninterrupted dual antiplatelet therapy with Aspirin 81mg  daily and Ticagrelor 90mg  twice daily for a minimum of 12 months (ACS-Class I recommendation). 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 Echo in am. May be a candidate for early discharge if no complications.   DG Chest Port 1 View  Result Date: 07/31/2023 CLINICAL DATA:  STEMI EXAM: PORTABLE CHEST 1 VIEW COMPARISON:  June 14, 2023 FINDINGS: The heart  size and mediastinal contours are within normal limits. Low lung volumes are noted with mild, stable elevation of the right hemidiaphragm. Mild, stable right basilar atelectasis is seen. There is no evidence of an acute infiltrate, pleural effusion or pneumothorax. Multiple chronic right-sided rib fractures are noted. The visualized skeletal structures are otherwise unremarkable. IMPRESSION: Low lung volumes with mild, stable right basilar atelectasis. Electronically Signed   By: Aram Candela M.D.   On: 07/31/2023 23:06    Cardiac Studies   Cardiac catheterization/PCI and stent (07/31/2023)  Conclusion      Prox LAD lesion is 30% stenosed.   Ost Cx to Mid Cx lesion is 30% stenosed.   Ramus lesion is 20% stenosed.   Ost RCA to Prox RCA lesion is 100% stenosed.   A drug-eluting stent was successfully placed using a STENT SYNERGY XD 3.0X32.   A drug-eluting stent was successfully placed using a SYNERGY XD 3.50X12.   A drug-eluting stent was successfully placed using a SYNERGY XD 4.0X12.   Post intervention, there is a 0% residual stenosis.   Recommend uninterrupted dual antiplatelet therapy with Aspirin 81mg  daily and Ticagrelor 90mg  twice daily for a minimum of 12 months (ACS-Class I recommendation).   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 Echo in am. May be a candidate for early discharge if no complications.   Coronary Diagrams  Diagnostic Dominance: Right  Intervention      Patient Profile     Eric [name] is a 52 y.o. male with hypertension, hyperlipidemia, opiate disorder, severe anxiety, diabetes, who is being seen 08/01/2023 for the evaluation of chest pain   Assessment & Plan    1: Inferior STEMI-patient presented with inferior ST segment elevation.  He was taken urgently to the Cath Lab by Dr. Swaziland who demonstrated nonobstructive disease  in the proximal LAD and ramus branch with an occluded true ostial RCA.  He had tortuous upper extremity anatomy which made intervention technically challenging.  He was able to put in 3 drug-eluting stents in reestablished antegrade flow.  His EKG has improved.  He currently is on DAPT which will need to be on uninterrupted for at least 12 months.  He is pain-free.  2D echo performed at bedside today on my review revealed preserved LV function.  2: Essential hypertension-on high-dose beta-blocker and clonidine at home.  Blood pressures have remained elevated.  He did have a run of nonsustained ventricular tachycardia with normal electrolytes.  Will check a magnesium.  Increase metoprolol from 50 to 75 mg p.o. twice daily, taper clonidine and continue losartan 100 mg.  3: Hyperlipidemia-on high-dose statin with lipid profile revealing an LDL of 35  4: Tobacco abuse-smoking several cigarettes a day with intent to quit  5: Type 2 diabetes-hemoglobin A1c 6.8.  On sliding scale insulin male, on glipizide at home.  Day 1 inferior STEMI.  Uncomplicated and hemodynamically stable.  Will keep in unit today and consider early discharge tomorrow morning.   For questions or updates, please contact  [facility] Please consult [email or web address] for contact info under        Signed, Nanetta Batty, MD  08/01/2023, 8:56 AM

## 2023-08-01 NOTE — Plan of Care (Signed)
  Problem: Education: Goal: Knowledge of General Education information will improve Description: Including pain rating scale, medication(s)/side effects and non-pharmacologic comfort measures Outcome: Progressing   Problem: Health Behavior/Discharge Planning: Goal: Ability to manage health-related needs will improve Outcome: Progressing   Problem: Clinical Measurements: Goal: Ability to maintain clinical measurements within normal limits will improve Outcome: Progressing Goal: Will remain free from infection Outcome: Progressing Goal: Diagnostic test results will improve Outcome: Progressing Goal: Respiratory complications will improve Outcome: Progressing Goal: Cardiovascular complication will be avoided Outcome: Progressing   Problem: Activity: Goal: Risk for activity intolerance will decrease Outcome: Progressing   Problem: Nutrition: Goal: Adequate nutrition will be maintained Outcome: Progressing   Problem: Coping: Goal: Level of anxiety will decrease Outcome: Progressing   Problem: Elimination: Goal: Will not experience complications related to bowel motility Outcome: Progressing Goal: Will not experience complications related to urinary retention Outcome: Progressing   Problem: Pain Management: Goal: General experience of comfort will improve Outcome: Progressing   Problem: Safety: Goal: Ability to remain free from injury will improve Outcome: Progressing   Problem: Skin Integrity: Goal: Risk for impaired skin integrity will decrease Outcome: Progressing   Problem: Education: Goal: Ability to describe self-care measures that may prevent or decrease complications (Diabetes Survival Skills Education) will improve Outcome: Progressing Goal: Individualized Educational Video(s) Outcome: Progressing   Problem: Coping: Goal: Ability to adjust to condition or change in health will improve Outcome: Progressing   Problem: Fluid Volume: Goal: Ability to  maintain a balanced intake and output will improve Outcome: Progressing   Problem: Health Behavior/Discharge Planning: Goal: Ability to identify and utilize available resources and services will improve Outcome: Progressing Goal: Ability to manage health-related needs will improve Outcome: Progressing   Problem: Metabolic: Goal: Ability to maintain appropriate glucose levels will improve Outcome: Progressing   Problem: Nutritional: Goal: Maintenance of adequate nutrition will improve Outcome: Progressing Goal: Progress toward achieving an optimal weight will improve Outcome: Progressing   Problem: Skin Integrity: Goal: Risk for impaired skin integrity will decrease Outcome: Progressing   Problem: Tissue Perfusion: Goal: Adequacy of tissue perfusion will improve Outcome: Progressing   Problem: Education: Goal: Understanding of cardiac disease, CV risk reduction, and recovery process will improve Outcome: Progressing Goal: Individualized Educational Video(s) Outcome: Progressing   Problem: Activity: Goal: Ability to tolerate increased activity will improve Outcome: Progressing   Problem: Cardiac: Goal: Ability to achieve and maintain adequate cardiovascular perfusion will improve Outcome: Progressing   Problem: Health Behavior/Discharge Planning: Goal: Ability to safely manage health-related needs after discharge will improve Outcome: Progressing   Problem: Education: Goal: Understanding of CV disease, CV risk reduction, and recovery process will improve Outcome: Progressing Goal: Individualized Educational Video(s) Outcome: Progressing   Problem: Activity: Goal: Ability to return to baseline activity level will improve Outcome: Progressing   Problem: Cardiovascular: Goal: Ability to achieve and maintain adequate cardiovascular perfusion will improve Outcome: Progressing Goal: Vascular access site(s) Level 0-1 will be maintained Outcome: Progressing    Problem: Health Behavior/Discharge Planning: Goal: Ability to safely manage health-related needs after discharge will improve Outcome: Progressing

## 2023-08-01 NOTE — Progress Notes (Signed)
eLink Physician-Brief Progress Note Patient Name: Eric [name] DOB: [birth year] MRN: [id number]   Date of Service  08/01/2023  HPI/Events of Note  53 y.o. male with hypertension, hyperlipidemia, opiate disorder, severe anxiety, diabetes, who is being seen 08/01/2023 for the evaluation of chest pain found to have an inferior STEMI in the setting of his severe anxiety and opiate dependence.  On evaluation, the patient is tachypneic, tachycardic and hypertensive.  Saturating 93% on 2 L of oxygen.  Laboratory studies show essentially normal metabolic panel, CBC, coagulation studies and mild hyperglycemia.  Chest radiograph fairly unremarkable.  eICU Interventions  Maintain dual antiplatelet therapy with ticagrelor and aspirin  Holding home clonidine, oxycodone, Fioricet.  Monitor for withdrawal  Maintain baseline anxiolytics with alprazolam  DVT prophylaxis with enoxaparin GI prophylaxis not indicated     Intervention Category Evaluation Type: New Patient Evaluation  [name] 08/01/2023, 1:37 AM
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
## 2023-08-02 ENCOUNTER — Other Ambulatory Visit (HOSPITAL_COMMUNITY): Payer: Self-pay

## 2023-08-02 ENCOUNTER — Encounter (HOSPITAL_COMMUNITY): Payer: Self-pay | Admitting: Cardiology

## 2023-08-02 ENCOUNTER — Ambulatory Visit: Payer: Medicaid Other | Admitting: Internal Medicine

## 2023-08-02 ENCOUNTER — Telehealth (HOSPITAL_COMMUNITY): Payer: Self-pay

## 2023-08-02 DIAGNOSIS — I5020 Unspecified systolic (congestive) heart failure: Secondary | ICD-10-CM | POA: Insufficient documentation

## 2023-08-02 DIAGNOSIS — E785 Hyperlipidemia, unspecified: Secondary | ICD-10-CM | POA: Insufficient documentation

## 2023-08-02 DIAGNOSIS — E118 Type 2 diabetes mellitus with unspecified complications: Secondary | ICD-10-CM | POA: Insufficient documentation

## 2023-08-02 DIAGNOSIS — I2119 ST elevation (STEMI) myocardial infarction involving other coronary artery of inferior wall: Secondary | ICD-10-CM | POA: Diagnosis not present

## 2023-08-02 LAB — GLUCOSE, CAPILLARY
Glucose-Capillary: 127 mg/dL — ABNORMAL HIGH (ref 70–99)
Glucose-Capillary: 102 mg/dL — ABNORMAL HIGH (ref 70–99)

## 2023-08-02 LAB — LIPOPROTEIN A (LPA): Lipoprotein (a): <8.4 nmol/L (ref <75.0)

## 2023-08-02 LAB — MAGNESIUM: Magnesium: 2.1 mg/dL (ref 1.7–2.4)

## 2023-08-02 LAB — BASIC METABOLIC PANEL
Anion gap: 8 (ref 5–15)
BUN: 12 mg/dL (ref 6–20)
CO2: 25 mmol/L (ref 22–32)
Calcium: 8.9 mg/dL (ref 8.9–10.3)
Chloride: 104 mmol/L (ref 98–111)
Creatinine, Ser: 0.75 mg/dL (ref 0.61–1.24)
GFR, Estimated: >60 mL/min (ref >60)
Glucose, Bld: 96 mg/dL (ref 70–99)
Potassium: 4.3 mmol/L (ref 3.5–5.1)
Sodium: 137 mmol/L (ref 135–145)

## 2023-08-02 MED ORDER — ASPIRIN 81 MG PO TBEC
81.0000 mg | DELAYED_RELEASE_TABLET | Freq: Every day | ORAL | 2 refills | Status: AC
  Filled 2023-08-02: qty 90, 90d supply, fill #0

## 2023-08-02 MED ORDER — ATORVASTATIN CALCIUM 80 MG PO TABS
80.0000 mg | ORAL_TABLET | Freq: Every day | ORAL | 1 refills | Status: DC
  Filled 2023-08-02: qty 90, 90d supply, fill #0

## 2023-08-02 MED ORDER — METOPROLOL SUCCINATE ER 50 MG PO TB24
150.0000 mg | ORAL_TABLET | Freq: Every day | ORAL | Status: DC
  Administered 2023-08-02: 150 mg via ORAL
  Filled 2023-08-02: qty 3

## 2023-08-02 MED ORDER — NITROGLYCERIN 0.4 MG SL SUBL
0.4000 mg | SUBLINGUAL_TABLET | SUBLINGUAL | 2 refills | Status: DC | PRN
  Filled 2023-08-02: qty 25, 1d supply, fill #0
  Filled 2023-11-22: qty 25, 1d supply, fill #1

## 2023-08-02 MED ORDER — EMPAGLIFLOZIN 10 MG PO TABS
10.0000 mg | ORAL_TABLET | Freq: Every day | ORAL | Status: DC
  Administered 2023-08-02: 10 mg via ORAL
  Filled 2023-08-02: qty 1

## 2023-08-02 MED ORDER — TICAGRELOR 90 MG PO TABS
90.0000 mg | ORAL_TABLET | Freq: Two times a day (BID) | ORAL | 2 refills | Status: DC
  Filled 2023-08-02: qty 180, 90d supply, fill #0
  Filled 2023-10-15: qty 180, 90d supply, fill #1

## 2023-08-02 MED ORDER — METOPROLOL SUCCINATE ER 50 MG PO TB24
150.0000 mg | ORAL_TABLET | Freq: Every day | ORAL | 1 refills | Status: DC
  Filled 2023-08-02: qty 90, 30d supply, fill #0

## 2023-08-02 MED ORDER — EMPAGLIFLOZIN 10 MG PO TABS
10.0000 mg | ORAL_TABLET | Freq: Every day | ORAL | 1 refills | Status: DC
  Filled 2023-08-02: qty 30, 30d supply, fill #0

## 2023-08-02 NOTE — Telephone Encounter (Signed)
Received denial, due to lack of chart notes, resubmitting, new key below  [id number]

## 2023-08-02 NOTE — Progress Notes (Signed)
Rounding Note    Patient Name: Eric [name] Date of Encounter: 08/02/2023  [facility] Cardiologist: Dr. Peter Swaziland  Subjective   Pain-free this morning status post RCA intervention last night for inferior STEMI  Inpatient Medications    Scheduled Meds:  ALPRAZolam  1 mg Oral TID   aspirin EC  81 mg Oral Daily   atorvastatin  80 mg Oral Daily   Chlorhexidine Gluconate Cloth  6 each Topical Daily   cloNIDine  0.1 mg Oral BID   empagliflozin  10 mg Oral Daily   enoxaparin (LOVENOX) injection  40 mg Subcutaneous Daily   FLUoxetine  10 mg Oral Daily   folic acid  1 mg Oral Daily   insulin aspart  0-15 Units Subcutaneous TID WC   losartan  100 mg Oral Daily   metoprolol succinate  150 mg Oral Daily   sodium chloride flush  3 mL Intravenous Q12H   ticagrelor  90 mg Oral BID   Continuous Infusions:  sodium chloride     PRN Meds: sodium chloride, acetaminophen, cyclobenzaprine, hydrOXYzine, nitroGLYCERIN, ondansetron (ZOFRAN) IV, oxyCODONE-acetaminophen, sodium chloride flush   Vital Signs    Vitals:   08/02/23 0500 08/02/23 0600 08/02/23 0700 08/02/23 0753  BP: 123/84 107/83 116/85   Pulse: (!) 59 79 92   Resp: 16 19 (!) 26   Temp: 97.6 F (36.4 C)   98.5 F (36.9 C)  TempSrc: Oral   Oral  SpO2: 97% 94% 95%   Weight:      Height:        Intake/Output Summary (Last 24 hours) at 08/02/2023 0839 Last data filed at 08/02/2023 0400 Gross per 24 hour  Intake 400 ml  Output 0 ml  Net 400 ml      08/01/2023    6:45 AM 07/20/2023    2:11 PM 06/21/2023   12:26 PM  Last 3 Weights  Weight (lbs) 219 lb 5.7 oz 225 lb 220 lb  Weight (kg) 99.5 kg 102.059 kg 99.791 kg      Telemetry    Sinus rhythm with PVCs- Personally Reviewed  ECG    Sinus rhythm at 92 with small inferior Q waves- Personally Reviewed  Physical Exam   GEN: No acute distress.   Neck: No JVD Cardiac: RRR, no murmurs, rubs, or gallops.  Respiratory: Clear to auscultation  bilaterally. GI: Soft, nontender, non-distended  MS: No edema; No deformity. Neuro:  Nonfocal  Psych: Normal affect   Labs    High Sensitivity Troponin:   Recent Labs  Lab 07/31/23 2245 08/01/23 0435  TROPONINIHS 4 >24,000*     Chemistry Recent Labs  Lab 07/31/23 2245 08/01/23 0435 08/02/23 0249  NA 140 138 137  K 3.9 4.5 4.3  CL 103 107 104  CO2 23 23 25   GLUCOSE 126* 190* 96  BUN 16 11 12   CREATININE 0.72 0.76 0.75  CALCIUM 10.0 9.0 8.9  MG  --  1.9 2.1  PROT  --  6.9  --   ALBUMIN  --  3.7  --   AST  --  125*  --   ALT  --  35  --   ALKPHOS  --  42  --   BILITOT  --  0.5  --   GFRNONAA >60 >60 >60  ANIONGAP 14 8 8     Lipids  Recent Labs  Lab 08/01/23 0435  CHOL 134  TRIG 307*  HDL 38*  LDLCALC 35  CHOLHDL 3.5  Hematology Recent Labs  Lab 07/31/23 2245 08/01/23 0435  WBC 13.0* 13.5*  RBC 5.08 4.77  HGB 15.4 14.0  HCT 46.0 42.4  MCV 90.6 88.9  MCH 30.3 29.4  MCHC 33.5 33.0  RDW 12.4 12.3  PLT 272 239   Thyroid No results for input(s): "TSH", "FREET4" in the last 168 hours.  BNPNo results for input(s): "BNP", "PROBNP" in the last 168 hours.  DDimer No results for input(s): "DDIMER" in the last 168 hours.   Radiology    ECHOCARDIOGRAM COMPLETE  Result Date: 08/01/2023    ECHOCARDIOGRAM REPORT   Patient Name:   Eric [name] Date of Exam: 08/01/2023 Medical Rec #:  [id number]           Height:       72.0 in Accession #:    [id number]          Weight:       219.4 lb Date of Birth:  [birth year]           BSA:          2.216 m Patient Age:    52 years            BP:           147/99 mmHg Patient Gender: M                   HR:           80 bpm. Exam Location:  Inpatient Procedure: 2D Echo, Cardiac Doppler, Color Doppler and Intracardiac            Opacification Agent Indications:    Acute myocardial infarction I21.9  History:        Patient has no prior history of Echocardiogram examinations.                 Previous Myocardial Infarction and  CAD; Risk                 Factors:Hypertension and Current Smoker.  Sonographer:    Dondra Prader RVT RCS Referring Phys: Elmon Kirschner  Sonographer Comments: Technically difficult study due to poor echo windows, suboptimal apical window and suboptimal subcostal window. Image acquisition challenging due to patient body habitus and Image acquisition challenging due to respiratory motion. IMPRESSIONS  1. Left ventricular ejection fraction, by estimation, is 35 to 40%. The left ventricle has moderately decreased function. The left ventricle demonstrates regional wall motion abnormalities (see scoring diagram/findings for description). There is mild concentric left ventricular hypertrophy. Left ventricular diastolic parameters are indeterminate.  2. Right ventricular systolic function is moderately reduced. The right ventricular size is moderately enlarged. Tricuspid regurgitation signal is inadequate for assessing PA pressure.  3. The mitral valve is normal in structure. No evidence of mitral valve regurgitation. No evidence of mitral stenosis.  4. The aortic valve is normal in structure. Aortic valve regurgitation is not visualized. No aortic stenosis is present.  5. The inferior vena cava is normal in size with greater than 50% respiratory variability, suggesting right atrial pressure of 3 mmHg. FINDINGS  Left Ventricle: Left ventricular ejection fraction, by estimation, is 35 to 40%. The left ventricle has moderately decreased function. The left ventricle demonstrates regional wall motion abnormalities. Definity contrast agent was given IV to delineate the left ventricular endocardial borders. The left ventricular internal cavity size was normal in size. There is mild concentric left ventricular hypertrophy. Left ventricular diastolic parameters are indeterminate.  LV Wall Scoring: The inferior wall,  posterior wall, and basal inferoseptal segment are akinetic. The entire anterior wall, antero-lateral wall, entire  anterior septum, entire apex, and mid inferoseptal segment are hypokinetic. Right Ventricle: The right ventricular size is moderately enlarged. No increase in right ventricular wall thickness. Right ventricular systolic function is moderately reduced. Tricuspid regurgitation signal is inadequate for assessing PA pressure. Left Atrium: Left atrial size was normal in size. Right Atrium: Right atrial size was normal in size. Pericardium: There is no evidence of pericardial effusion. Presence of epicardial fat layer. Mitral Valve: The mitral valve is normal in structure. No evidence of mitral valve regurgitation. No evidence of mitral valve stenosis. Tricuspid Valve: The tricuspid valve is normal in structure. Tricuspid valve regurgitation is trivial. No evidence of tricuspid stenosis. Aortic Valve: The aortic valve is normal in structure. Aortic valve regurgitation is not visualized. No aortic stenosis is present. Aortic valve mean gradient measures 2.0 mmHg. Aortic valve peak gradient measures 3.2 mmHg. Aortic valve area, by VTI measures 3.55 cm. Pulmonic Valve: The pulmonic valve was normal in structure. Pulmonic valve regurgitation is not visualized. No evidence of pulmonic stenosis. Aorta: The aortic root is normal in size and structure. Venous: The inferior vena cava is normal in size with greater than 50% respiratory variability, suggesting right atrial pressure of 3 mmHg. IAS/Shunts: No atrial level shunt detected by color flow Doppler.  LEFT VENTRICLE PLAX 2D LVIDd:         5.00 cm   Diastology LVIDs:         3.30 cm   LV e' medial:    11.70 cm/s LV PW:         1.20 cm   LV E/e' medial:  5.3 LV IVS:        1.10 cm   LV e' lateral:   13.50 cm/s LVOT diam:     2.10 cm   LV E/e' lateral: 4.6 LV SV:         57 LV SV Index:   26 LVOT Area:     3.46 cm  RIGHT VENTRICLE            IVC RV Basal diam:  3.50 cm    IVC diam: 1.80 cm RV Mid diam:    4.30 cm RV S prime:     8.18 cm/s TAPSE (M-mode): 2.2 cm LEFT ATRIUM              Index        RIGHT ATRIUM           Index LA diam:        4.20 cm 1.90 cm/m   RA Area:     12.50 cm LA Vol (A2C):   36.9 ml 16.65 ml/m  RA Volume:   23.50 ml  10.61 ml/m LA Vol (A4C):   34.9 ml 15.75 ml/m LA Biplane Vol: 36.7 ml 16.56 ml/m  AORTIC VALVE                    PULMONIC VALVE AV Area (Vmax):    3.49 cm     PV Vmax:       1.17 m/s AV Area (Vmean):   3.54 cm     PV Peak grad:  5.5 mmHg AV Area (VTI):     3.55 cm AV Vmax:           89.20 cm/s AV Vmean:          59.700 cm/s AV VTI:  0.162 m AV Peak Grad:      3.2 mmHg AV Mean Grad:      2.0 mmHg LVOT Vmax:         89.90 cm/s LVOT Vmean:        61.000 cm/s LVOT VTI:          0.166 m LVOT/AV VTI ratio: 1.02  AORTA Ao Root diam: 3.10 cm Ao Asc diam:  3.70 cm MITRAL VALVE MV Area (PHT): 4.92 cm    SHUNTS MV Decel Time: 154 msec    Systemic VTI:  0.17 m MV E velocity: 61.43 cm/s  Systemic Diam: 2.10 cm MV A velocity: 81.60 cm/s MV E/A ratio:  0.75 Kardie Tobb DO Electronically signed by Thomasene Ripple DO Signature Date/Time: 08/01/2023/2:32:31 PM    Final    CARDIAC CATHETERIZATION  Result Date: 08/01/2023   Prox LAD lesion is 30% stenosed.   Ost Cx to Mid Cx lesion is 30% stenosed.   Ramus lesion is 20% stenosed.   Ost RCA to Prox RCA lesion is 100% stenosed.   A drug-eluting stent was successfully placed using a STENT SYNERGY XD 3.0X32.   A drug-eluting stent was successfully placed using a SYNERGY XD 3.50X12.   A drug-eluting stent was successfully placed using a SYNERGY XD 4.0X12.   Post intervention, there is a 0% residual stenosis.   Recommend uninterrupted dual antiplatelet therapy with Aspirin 81mg  daily and Ticagrelor 90mg  twice daily for a minimum of 12 months (ACS-Class I recommendation). 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 Echo in am. May be a candidate for early discharge if no complications.   DG Chest  Port 1 View  Result Date: 07/31/2023 CLINICAL DATA:  STEMI EXAM: PORTABLE CHEST 1 VIEW COMPARISON:  June 14, 2023 FINDINGS: The heart size and mediastinal contours are within normal limits. Low lung volumes are noted with mild, stable elevation of the right hemidiaphragm. Mild, stable right basilar atelectasis is seen. There is no evidence of an acute infiltrate, pleural effusion or pneumothorax. Multiple chronic right-sided rib fractures are noted. The visualized skeletal structures are otherwise unremarkable. IMPRESSION: Low lung volumes with mild, stable right basilar atelectasis. Electronically Signed   By: Aram Candela M.D.   On: 07/31/2023 23:06    Cardiac Studies   Cardiac catheterization/PCI and stent (07/31/2023)  Conclusion      Prox LAD lesion is 30% stenosed.   Ost Cx to Mid Cx lesion is 30% stenosed.   Ramus lesion is 20% stenosed.   Ost RCA to Prox RCA lesion is 100% stenosed.   A drug-eluting stent was successfully placed using a STENT SYNERGY XD 3.0X32.   A drug-eluting stent was successfully placed using a SYNERGY XD 3.50X12.   A drug-eluting stent was successfully placed using a SYNERGY XD 4.0X12.   Post intervention, there is a 0% residual stenosis.   Recommend uninterrupted dual antiplatelet therapy with Aspirin 81mg  daily and Ticagrelor 90mg  twice daily for a minimum of 12 months (ACS-Class I recommendation).   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 Echo in am. May be a candidate for early discharge if no complications.   Coronary Diagrams  Diagnostic Dominance: Right  Intervention      Patient Profile  Eric [name] is a 52 y.o. male with hypertension, hyperlipidemia, opiate disorder, severe anxiety, diabetes, who is being seen 08/01/2023 for the evaluation of chest pain   Assessment & Plan    1: Inferior STEMI-patient  presented with inferior ST segment elevation.  He was taken urgently to the Cath Lab by Dr. Swaziland who demonstrated nonobstructive disease in the proximal LAD and ramus branch with an occluded true ostial RCA.  He had tortuous upper extremity anatomy which made intervention technically challenging.  He was able to put in 3 drug-eluting stents in reestablished antegrade flow.  His EKG has improved.  He currently is on DAPT which will need to be on uninterrupted for at least 12 months.  He is pain-free.   2: Essential hypertension-on high-dose beta-blocker and clonidine at home.  Blood pressures have remained elevated.  He did have a run of nonsustained ventricular tachycardia with normal electrolytes.  Will check a magnesium.  Increase metoprolol from 50 to 75 mg p.o. twice daily, taper clonidine and continue losartan 100 mg.  3: Hyperlipidemia-on high-dose statin with lipid profile revealing an LDL of 35  4: Tobacco abuse-smoking several cigarettes a day with intent to quit  5: Type 2 diabetes-hemoglobin A1c 6.8.  On sliding scale insulin male, on glipizide at home.  7: Ischemic cardiomyopathy-EF 35 to 40%.  On GDMT.  Will repeat 2D echo in 3 months.  Day 2 inferior STEMI.  Uncomplicated and hemodynamically stable.  He did have a run of nonsustained ventricular tachycardia yesterday.  No further episodes since that time.  His beta-blocker was adjusted.  His electrolytes were on revealing.  His blood pressure is somewhat soft today.  Will discontinue clonidine.  Will get cardiac rehab ambulating around the unit in anticipation for discharge this afternoon.  He will need TOC 7 followed by return office visit with Dr. Swaziland.  For questions or updates, please contact [facility] Please consult [email or web address] for contact info under        Signed, Nanetta Batty, MD  08/02/2023, 8:39 AM

## 2023-08-02 NOTE — TOC Benefit Eligibility Note (Signed)
Pharmacy Patient Advocate Encounter  Insurance verification completed.    The patient is insured through [employer].     Ran test claim for Jardiance and the current 30 day co-pay is PA required.  Ran test claim for Marcelline Deist and the current 30 day co-pay is PA required.  This test claim was processed through [facility]- copay amounts may vary at other pharmacies due to pharmacy/plan contracts, or as the patient moves through the different stages of their insurance plan.

## 2023-08-02 NOTE — Progress Notes (Signed)
Patient and wife verbalized understanding of dc instructions.all papers and belongings given to patient.

## 2023-08-02 NOTE — Care Management (Signed)
Transition of Care [facility]) - Inpatient Brief Assessment   Patient Details  Name: STEPFON MEYEROWITZ MRN: [id number] Date of Birth: May 19, 1971  Transition of Care [facility]) CM/SW Contact:    Lockie Pares, RN Phone Number: 08/02/2023, 11:08 AM   Clinical Narrative:   Patient presented with STEMI, [facility] review reveals food insecurity. Resources provided on AVS for food pantries in [place], [place]   Transition of Care Asessment: Insurance and Status: Insurance coverage has been reviewed Patient has primary care physician: Yes Home environment has been reviewed: Yes Prior level of function:: Independent Prior/Current Home Services: No current home services Social Determinants of Health Reivew: SDOH reviewed needs interventions Readmission risk has been reviewed: Yes Transition of care needs: transition of care needs identified, TOC will continue to follow

## 2023-08-02 NOTE — Progress Notes (Signed)
Heart Failure Nurse Navigator Progress Note  PCP: [name], NP PCP-Cardiologist: [place] Admission Diagnosis: STEMI [facility]: [facility] via Care link  Presentation:   Eric [name] presented with sharp central chest pressure after having intercourse, shortness of breath. Code STEMI called in ED, BP 184/109, HR 123, Trop >24,000, EKG with NSR with ST elevation. Emergently for heart cath, showed Ost RCA to Prox RCA lesion is 100% stenosed,  Successful PCI of the proximal to mid RCA with overlapping DES x 3 ,   Patient and wife were educated on the sign and symptoms of heart failure, daily weights, when to call his doctor or go to the ED, Diet/ fluid restrictions, patient reported to using salt, and drinking about 3-4 Diet Mt. Dews per day, taking all medications as prescribed and attending all medical appointments, patient verbalized his understanding of education, a HF TOC appointment was scheduled for 08/15/2023 @ 11 am.   ECHO/ LVEF: 35-40%  Clinical Course:  Past Medical History:  Diagnosis Date   Anxiety    Broken neck (HCC)    Chronic back pain    Collapsed lung    Right   GERD (gastroesophageal reflux disease)    Migraines    due to hx of broken neck   Renal disorder    Rib fractures      Social History   Socioeconomic History   Marital status: Married    Spouse name: Not on file   Number of children: Not on file   Years of education: Not on file   Highest education level: Not on file  Occupational History   Not on file  Tobacco Use   Smoking status: Every Day    Current packs/day: 0.50    Average packs/day: 0.5 packs/day for 25.0 years (12.5 ttl pk-yrs)    Types: Cigarettes   Smokeless tobacco: Never  Vaping Use   Vaping status: Never Used  Substance and Sexual Activity   Alcohol use: Not Currently    Comment: seldom use per pt   Drug use: No   Sexual activity: Never  Other Topics Concern   Not on file  Social History Narrative    Not on file   Social Determinants of Health   Financial Resource Strain: Not on file  Food Insecurity: Food Insecurity Present (08/01/2023)   Hunger Vital Sign    Worried About Running Out of Food in the Last Year: Sometimes true    Ran Out of Food in the Last Year: Never true  Transportation Needs: No Transportation Needs (08/01/2023)   PRAPARE - Administrator, Civil Service (Medical): No    Lack of Transportation (Non-Medical): No  Physical Activity: Not on file  Stress: Not on file  Social Connections: Unknown (01/07/2022)   Received from [facility]   Social Network    Social Network: Not on file   Education Assessment and Provision:  Detailed education and instructions provided on heart failure disease management including the following:  Signs and symptoms of Heart Failure When to call the physician Importance of daily weights Low sodium diet Fluid restriction Medication management Anticipated future follow-up appointments  Patient education given on each of the above topics.  Patient acknowledges understanding via teach back method and acceptance of all instructions.  Education Materials:  "Living Better With Heart Failure" Booklet, HF zone tool, & Daily Weight Tracker Tool.  Patient has scale at home: Yes Patient has pill box at home: Yes  High Risk Criteria for Readmission and/or Poor Patient Outcomes: Heart failure hospital admissions (last 6 months): 0  No Show rate: 18% Difficult social situation: No, lives with wife Demonstrates medication adherence: Yes Primary Language: English Literacy level: Reading, writing, and comprehension  Barriers of Care:   Diet/ fluid restrictions ( salt/ soda)  Daily weights   Considerations/Referrals:   Referral made to Heart Failure Pharmacist Stewardship: No Referral made to Heart Failure CSW/NCM TOC: No Referral made to Heart & Vascular TOC clinic: Yes, 08/15/2023@ 11 am  Items for  Follow-up on DC/TOC: Continued HF education  Diet/ fluid restrictions ( salt/ soda) Daily weights   Rhae Hammock, BSN, RN Heart Failure Teacher, adult education Only

## 2023-08-02 NOTE — Telephone Encounter (Signed)
Pharmacy Patient Advocate Encounter  Insurance verification completed.    The patient is insured through [employer]   The patient is currently admitted and ran test claims for the following: Farxiga, Jardiance.  Copays and coinsurance results were relayed to Inpatient clinical team.

## 2023-08-02 NOTE — Progress Notes (Signed)
CARDIAC REHAB PHASE I   PRE:  Rate/Rhythm: 84 SR   BP:  Sitting: 97/62      SaO2: 98 RA   MODE:  Ambulation: 370 ft   POST:  Rate/Rhythm: 86 SR   BP:  Sitting: 123/91      SaO2: 98 RA   Pt ambulated in hallway, tolerating well with no CP, SOB or dizziness. Returned to bed with call bell and bedside table in reach. Post MI/stent education including restrictions, risk factors, exercise guidelines, antiplatelet therapy importance, MI booklet, NTG use, heart healthy diabetic diet, smoking cessation and CRP2 reviewed. All questions and concerns addressed. Will refer to AP for CRP2.   [id number] Woodroe Chen, RN BSN 08/02/2023 9:27 AM

## 2023-08-02 NOTE — Plan of Care (Signed)
  Problem: Clinical Measurements: Goal: Ability to maintain clinical measurements within normal limits will improve Outcome: Progressing Goal: Will remain free from infection Outcome: Progressing Goal: Diagnostic test results will improve Outcome: Progressing Goal: Cardiovascular complication will be avoided Outcome: Progressing   Problem: Activity: Goal: Risk for activity intolerance will decrease Outcome: Progressing   Problem: Coping: Goal: Level of anxiety will decrease Outcome: Progressing   Problem: Elimination: Goal: Will not experience complications related to bowel motility Outcome: Progressing Goal: Will not experience complications related to urinary retention Outcome: Progressing

## 2023-08-02 NOTE — Telephone Encounter (Signed)
Pharmacy Patient Advocate Encounter   Received notification from  [facility]  that prior authorization for Jardiance 10mg  is required/requested.   Insurance verification completed.   The patient is insured through [facility] .   Per test claim: PA required; PA submitted to above mentioned insurance via CoverMyMeds Key/confirmation #/EOC [id number] Status is pending

## 2023-08-02 NOTE — Discharge Summary (Signed)
Discharge Summary    Patient ID: Eric [name] MRN: [id number]; DOB: 10-30-1970  Admit date: 07/31/2023 Discharge date: 08/02/2023  PCP:  [name], NP   [facility] Providers Cardiologist:  Peter Swaziland, MD   {  Discharge Diagnoses    Principal Problem:   Acute ST elevation myocardial infarction (STEMI) [facility]) Active Problems:   Tobacco abuse   ST elevation myocardial infarction involving right coronary artery (HCC)   HFrEF (heart failure with reduced ejection fraction) (HCC)   Hyperlipidemia   Type 2 diabetes mellitus with complication, without long-term current use of insulin [facility])   Diagnostic Studies/Procedures    Cath: 07/31/2023    Prox LAD lesion is 30% stenosed.   Ost Cx to Mid Cx lesion is 30% stenosed.   Ramus lesion is 20% stenosed.   Ost RCA to Prox RCA lesion is 100% stenosed.   A drug-eluting stent was successfully placed using a STENT SYNERGY XD 3.0X32.   A drug-eluting stent was successfully placed using a SYNERGY XD 3.50X12.   A drug-eluting stent was successfully placed using a SYNERGY XD 4.0X12.   Post intervention, there is a 0% residual stenosis.   Recommend uninterrupted dual antiplatelet therapy with Aspirin 81mg  daily and Ticagrelor 90mg  twice daily for a minimum of 12 months (ACS-Class I recommendation).   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 Echo in am. May be a candidate for early discharge if no complications.   Diagnostic Dominance: Right  Intervention    Echo: 08/01/2023  IMPRESSIONS     1. Left ventricular ejection fraction, by estimation, is 35 to 40%. The  left ventricle has moderately decreased function. The left ventricle  demonstrates regional wall motion abnormalities (see scoring  diagram/findings for description). There is mild  concentric left ventricular hypertrophy. Left  ventricular diastolic  parameters are indeterminate.   2. Right ventricular systolic function is moderately reduced. The right  ventricular size is moderately enlarged. Tricuspid regurgitation signal is  inadequate for assessing PA pressure.   3. The mitral valve is normal in structure. No evidence of mitral valve  regurgitation. No evidence of mitral stenosis.   4. The aortic valve is normal in structure. Aortic valve regurgitation is  not visualized. No aortic stenosis is present.   5. The inferior vena cava is normal in size with greater than 50%  respiratory variability, suggesting right atrial pressure of 3 mmHg.   FINDINGS   Left Ventricle: Left ventricular ejection fraction, by estimation, is 35  to 40%. The left ventricle has moderately decreased function. The left  ventricle demonstrates regional wall motion abnormalities. Definity  contrast agent was given IV to delineate  the left ventricular endocardial borders. The left ventricular internal  cavity size was normal in size. There is mild concentric left ventricular  hypertrophy. Left ventricular diastolic parameters are indeterminate.     LV Wall Scoring:  The inferior wall, posterior wall, and basal inferoseptal segment are  akinetic. The entire anterior wall, antero-lateral wall, entire anterior  septum, entire apex, and mid inferoseptal segment are hypokinetic.   Right Ventricle: The right ventricular size is moderately enlarged. No  increase in right ventricular wall thickness. Right ventricular systolic  function is moderately reduced. Tricuspid regurgitation signal is  inadequate for assessing PA pressure.   Left Atrium: Left atrial size was normal in size.  Right Atrium: Right atrial size was normal in size.   Pericardium: There is no evidence of pericardial effusion. Presence of  epicardial fat layer.   Mitral Valve: The mitral valve is normal in structure. No evidence of  mitral valve regurgitation. No  evidence of mitral valve stenosis.   Tricuspid Valve: The tricuspid valve is normal in structure. Tricuspid  valve regurgitation is trivial. No evidence of tricuspid stenosis.   Aortic Valve: The aortic valve is normal in structure. Aortic valve  regurgitation is not visualized. No aortic stenosis is present. Aortic  valve mean gradient measures 2.0 mmHg. Aortic valve peak gradient measures  3.2 mmHg. Aortic valve area, by VTI  measures 3.55 cm.   Pulmonic Valve: The pulmonic valve was normal in structure. Pulmonic valve  regurgitation is not visualized. No evidence of pulmonic stenosis.   Aorta: The aortic root is normal in size and structure.   Venous: The inferior vena cava is normal in size with greater than 50%  respiratory variability, suggesting right atrial pressure of 3 mmHg.   IAS/Shunts: No atrial level shunt detected by color flow Doppler.   _____________   History of Present Illness     Eric [name] is a 52 y.o. male with  with hypertension, hyperlipidemia, opiate disorder, severe anxiety, diabetes, who was seen 08/01/2023 for the evaluation of chest pain.   Patient reported sharp central chest pain that began 15 minutes ago after intercourse the evening of admission.  He also noted shortness of breath, and came to the ER for further evaluation.  Upon arrival to ER her EKG shows inferior ST elevation, code STEMI called and transferred here.  He got full dose aspirin, IV heparin 4000 units and morphine.  Upon [facility] to [facility] he was comfortable but hypertensive, still has mild chest pain.' Patient smokes cigarettes, denied any alcohol or drug use. He is on opiate medication, has severe anxiety and is on benzodiazepine.  He has history of hypertension, diabetes, A1c 6.8   Hospital Course     Inferior STEMI -- Underwent cardiac catheterization with single-vessel occlusive CAD involving a large RCA treated with PCI/DES x 3.  Recommendations for DAPT with  aspirin/Brilinta for at least 1 year.  High-sensitivity troponin peaked at greater than 24,000. Seen by cardiac rehab, no recurrent chest pain. -- Continue aspirin, Brilinta, atorvastatin 80 mg daily, Toprol XL 150 mg daily, losartan 100 mg daily  Acute HFrEF ICM -- LVEF of 35 to 40%, moderately enlarged/reduced RV, akinesis of the inferior, posterior and basal inferior septal wall -- GDMT: Continue metoprolol XL 150 mg daily, losartan 100 mg daily Jardiance -- Plan for repeat echocardiogram in 3 months  Hyperlipidemia -- LDL 35, HDL 38 -- Increased atorvastatin from 40 to 80 mg daily -- Will need LFT/FLP in 8 weeks  Diabetes -- Hemoglobin A1c 6.8 -- Continue home glipizide, jardiance   Tobacco abuse -- Cessation advised  Patient seen by Dr. Allyson Sabal and deemed stable for discharge home.  Follow-up arranged in the office.  Medication sent to [facility] pharmacy and educated by Pharm.D. prior to discharge.  Did the patient have an acute coronary syndrome (MI, NSTEMI, STEMI, etc) this admission?:  Yes                               AHA/ACC ACS Clinical Performance & Quality Measures: Aspirin prescribed? - Yes ADP Receptor Inhibitor (Plavix/Clopidogrel, Brilinta/Ticagrelor or Effient/Prasugrel) prescribed (includes medically managed patients)? -  Yes Beta Blocker prescribed? - Yes High Intensity Statin (Lipitor 40-80mg  or Crestor 20-40mg ) prescribed? - Yes EF assessed during THIS hospitalization? - Yes For EF <40%, was ACEI/ARB prescribed? - Yes For EF <40%, Aldosterone Antagonist (Spironolactone or Eplerenone) prescribed? - No - Reason:  consider as an outpatient Cardiac Rehab Phase II ordered (including medically managed patients)? - Yes   The patient will be scheduled for a TOC follow up appointment in 10-14 days.  A message has been sent to the [facility] and Scheduling Pool at the office where the patient should be seen for follow up.  _____________  Discharge Vitals Blood pressure 107/71,  pulse 77, temperature 97.8 F (36.6 C), temperature source Oral, resp. rate 20, height 6' (1.829 m), weight 99.5 kg, SpO2 93%.  Filed Weights   08/01/23 0645  Weight: 99.5 kg    Labs & Radiologic Studies    CBC Recent Labs    07/31/23 2245 08/01/23 0435  WBC 13.0* 13.5*  HGB 15.4 14.0  HCT 46.0 42.4  MCV 90.6 88.9  PLT 272 239   Basic Metabolic Panel Recent Labs    21/30/86 0435 08/02/23 0249  NA 138 137  K 4.5 4.3  CL 107 104  CO2 23 25  GLUCOSE 190* 96  BUN 11 12  CREATININE 0.76 0.75  CALCIUM 9.0 8.9  MG 1.9 2.1   Liver Function Tests Recent Labs    08/01/23 0435  AST 125*  ALT 35  ALKPHOS 42  BILITOT 0.5  PROT 6.9  ALBUMIN 3.7   No results for input(s): "LIPASE", "AMYLASE" in the last 72 hours. High Sensitivity Troponin:   Recent Labs  Lab 07/31/23 2245 08/01/23 0435  TROPONINIHS 4 >24,000*    BNP Invalid input(s): "POCBNP" D-Dimer No results for input(s): "DDIMER" in the last 72 hours. Hemoglobin A1C Recent Labs    07/31/23 2245  HGBA1C 6.1*   Fasting Lipid Panel Recent Labs    07/31/23 2245 08/01/23 0435  CHOL 154 134  HDL 36* 38*  LDLCALC UNABLE TO CALCULATE IF TRIGLYCERIDE OVER 400 mg/dL 35  TRIG 578* 469*  CHOLHDL 4.3 3.5  LDLDIRECT PENDING  --    Thyroid Function Tests No results for input(s): "TSH", "T4TOTAL", "T3FREE", "THYROIDAB" in the last 72 hours.  Invalid input(s): "FREET3" _____________  ECHOCARDIOGRAM COMPLETE  Result Date: 08/01/2023    ECHOCARDIOGRAM REPORT   Patient Name:   Eric [name] Date of Exam: 08/01/2023 Medical Rec #:  [id number]           Height:       72.0 in Accession #:    [id number]          Weight:       219.4 lb Date of Birth:  [birth year]           BSA:          2.216 m Patient Age:    52 years            BP:           147/99 mmHg Patient Gender: M                   HR:           80 bpm. Exam Location:  Inpatient Procedure: 2D Echo, Cardiac Doppler, Color Doppler and Intracardiac             Opacification Agent Indications:    Acute myocardial infarction I21.9  History:  Patient has no prior history of Echocardiogram examinations.                 Previous Myocardial Infarction and CAD; Risk                 Factors:Hypertension and Current Smoker.  Sonographer:    Dondra Prader RVT RCS Referring Phys: Elmon Kirschner  Sonographer Comments: Technically difficult study due to poor echo windows, suboptimal apical window and suboptimal subcostal window. Image acquisition challenging due to patient body habitus and Image acquisition challenging due to respiratory motion. IMPRESSIONS  1. Left ventricular ejection fraction, by estimation, is 35 to 40%. The left ventricle has moderately decreased function. The left ventricle demonstrates regional wall motion abnormalities (see scoring diagram/findings for description). There is mild concentric left ventricular hypertrophy. Left ventricular diastolic parameters are indeterminate.  2. Right ventricular systolic function is moderately reduced. The right ventricular size is moderately enlarged. Tricuspid regurgitation signal is inadequate for assessing PA pressure.  3. The mitral valve is normal in structure. No evidence of mitral valve regurgitation. No evidence of mitral stenosis.  4. The aortic valve is normal in structure. Aortic valve regurgitation is not visualized. No aortic stenosis is present.  5. The inferior vena cava is normal in size with greater than 50% respiratory variability, suggesting right atrial pressure of 3 mmHg. FINDINGS  Left Ventricle: Left ventricular ejection fraction, by estimation, is 35 to 40%. The left ventricle has moderately decreased function. The left ventricle demonstrates regional wall motion abnormalities. Definity contrast agent was given IV to delineate the left ventricular endocardial borders. The left ventricular internal cavity size was normal in size. There is mild concentric left ventricular hypertrophy. Left  ventricular diastolic parameters are indeterminate.  LV Wall Scoring: The inferior wall, posterior wall, and basal inferoseptal segment are akinetic. The entire anterior wall, antero-lateral wall, entire anterior septum, entire apex, and mid inferoseptal segment are hypokinetic. Right Ventricle: The right ventricular size is moderately enlarged. No increase in right ventricular wall thickness. Right ventricular systolic function is moderately reduced. Tricuspid regurgitation signal is inadequate for assessing PA pressure. Left Atrium: Left atrial size was normal in size. Right Atrium: Right atrial size was normal in size. Pericardium: There is no evidence of pericardial effusion. Presence of epicardial fat layer. Mitral Valve: The mitral valve is normal in structure. No evidence of mitral valve regurgitation. No evidence of mitral valve stenosis. Tricuspid Valve: The tricuspid valve is normal in structure. Tricuspid valve regurgitation is trivial. No evidence of tricuspid stenosis. Aortic Valve: The aortic valve is normal in structure. Aortic valve regurgitation is not visualized. No aortic stenosis is present. Aortic valve mean gradient measures 2.0 mmHg. Aortic valve peak gradient measures 3.2 mmHg. Aortic valve area, by VTI measures 3.55 cm. Pulmonic Valve: The pulmonic valve was normal in structure. Pulmonic valve regurgitation is not visualized. No evidence of pulmonic stenosis. Aorta: The aortic root is normal in size and structure. Venous: The inferior vena cava is normal in size with greater than 50% respiratory variability, suggesting right atrial pressure of 3 mmHg. IAS/Shunts: No atrial level shunt detected by color flow Doppler.  LEFT VENTRICLE PLAX 2D LVIDd:         5.00 cm   Diastology LVIDs:         3.30 cm   LV e' medial:    11.70 cm/s LV PW:         1.20 cm   LV E/e' medial:  5.3 LV IVS:  1.10 cm   LV e' lateral:   13.50 cm/s LVOT diam:     2.10 cm   LV E/e' lateral: 4.6 LV SV:         57 LV  SV Index:   26 LVOT Area:     3.46 cm  RIGHT VENTRICLE            IVC RV Basal diam:  3.50 cm    IVC diam: 1.80 cm RV Mid diam:    4.30 cm RV S prime:     8.18 cm/s TAPSE (M-mode): 2.2 cm LEFT ATRIUM             Index        RIGHT ATRIUM           Index LA diam:        4.20 cm 1.90 cm/m   RA Area:     12.50 cm LA Vol (A2C):   36.9 ml 16.65 ml/m  RA Volume:   23.50 ml  10.61 ml/m LA Vol (A4C):   34.9 ml 15.75 ml/m LA Biplane Vol: 36.7 ml 16.56 ml/m  AORTIC VALVE                    PULMONIC VALVE AV Area (Vmax):    3.49 cm     PV Vmax:       1.17 m/s AV Area (Vmean):   3.54 cm     PV Peak grad:  5.5 mmHg AV Area (VTI):     3.55 cm AV Vmax:           89.20 cm/s AV Vmean:          59.700 cm/s AV VTI:            0.162 m AV Peak Grad:      3.2 mmHg AV Mean Grad:      2.0 mmHg LVOT Vmax:         89.90 cm/s LVOT Vmean:        61.000 cm/s LVOT VTI:          0.166 m LVOT/AV VTI ratio: 1.02  AORTA Ao Root diam: 3.10 cm Ao Asc diam:  3.70 cm MITRAL VALVE MV Area (PHT): 4.92 cm    SHUNTS MV Decel Time: 154 msec    Systemic VTI:  0.17 m MV E velocity: 61.43 cm/s  Systemic Diam: 2.10 cm MV A velocity: 81.60 cm/s MV E/A ratio:  0.75 Kardie Tobb DO Electronically signed by Thomasene Ripple DO Signature Date/Time: 08/01/2023/2:32:31 PM    Final    CARDIAC CATHETERIZATION  Result Date: 08/01/2023   Prox LAD lesion is 30% stenosed.   Ost Cx to Mid Cx lesion is 30% stenosed.   Ramus lesion is 20% stenosed.   Ost RCA to Prox RCA lesion is 100% stenosed.   A drug-eluting stent was successfully placed using a STENT SYNERGY XD 3.0X32.   A drug-eluting stent was successfully placed using a SYNERGY XD 3.50X12.   A drug-eluting stent was successfully placed using a SYNERGY XD 4.0X12.   Post intervention, there is a 0% residual stenosis.   Recommend uninterrupted dual antiplatelet therapy with Aspirin 81mg  daily and Ticagrelor 90mg  twice daily for a minimum of 12 months (ACS-Class I recommendation). Single vessel occlusive CAD  involving a large RCA LVEDP 19 mm Hg Successful PCI of the proximal to mid RCA with overlapping DES x 3 Plan: DAPT for one year. Echo in am. May be a candidate  for early discharge if no complications.   DG Chest Port 1 View  Result Date: 07/31/2023 CLINICAL DATA:  STEMI EXAM: PORTABLE CHEST 1 VIEW COMPARISON:  June 14, 2023 FINDINGS: The heart size and mediastinal contours are within normal limits. Low lung volumes are noted with mild, stable elevation of the right hemidiaphragm. Mild, stable right basilar atelectasis is seen. There is no evidence of an acute infiltrate, pleural effusion or pneumothorax. Multiple chronic right-sided rib fractures are noted. The visualized skeletal structures are otherwise unremarkable. IMPRESSION: Low lung volumes with mild, stable right basilar atelectasis. Electronically Signed   By: Aram Candela M.D.   On: 07/31/2023 23:06   Disposition   Pt is being discharged home today in good condition.  Follow-up Plans & Appointments     Follow-up Information     [facility]. Go in 13 day(s).   Specialty: Cardiology Why: Hospital follow up 08/15/2023 @ 11 am PLEASE bring a current medication list to appointment FREE valet parking, Entrance C, off [employer] information: [address] [place] [address] [phone]               Discharge Instructions     Amb Referral to Cardiac Rehabilitation   Complete by: As directed    Diagnosis:  Coronary Stents STEMI     After initial evaluation and assessments completed: Virtual Based Care may be provided alone or in conjunction with Phase 2 Cardiac Rehab based on patient barriers.: Yes   Intensive Cardiac Rehabilitation (ICR) [facility] location only OR Traditional Cardiac Rehabilitation (TCR) *If criteria for ICR are not met will enroll in TCR [facility] only): Yes   Call MD for:  difficulty breathing, headache or visual disturbances    Complete by: As directed    Call MD for:  persistant dizziness or light-headedness   Complete by: As directed    Call MD for:  redness, tenderness, or signs of infection (pain, swelling, redness, odor or green/yellow discharge around incision site)   Complete by: As directed    Diet - low sodium heart healthy   Complete by: As directed    Discharge instructions   Complete by: As directed    Radial Site Care Refer to this sheet in the next few weeks. These instructions provide you with information on caring for yourself after your procedure. Your caregiver may also give you more specific instructions. Your treatment has been planned according to current medical practices, but problems sometimes occur. Call your caregiver if you have any problems or questions after your procedure. HOME CARE INSTRUCTIONS You may shower the day after the procedure. Remove the bandage (dressing) and gently wash the site with plain soap and water. Gently pat the site dry.  Do not apply powder or lotion to the site.  Do not submerge the affected site in water for 3 to 5 days.  Inspect the site at least twice daily.  Do not flex or bend the affected arm for 24 hours.  No lifting over 5 pounds (2.3 kg) for 5 days after your procedure.  Do not drive home if you are discharged the same day of the procedure. Have someone else drive you.  You may drive 24 hours after the procedure unless otherwise instructed by your caregiver.  What to expect: Any bruising will usually fade within 1 to 2 weeks.  Blood that collects in the tissue (hematoma) may be painful to the touch. It should usually decrease in  size and tenderness within 1 to 2 weeks.  SEEK IMMEDIATE MEDICAL CARE IF: You have unusual pain at the radial site.  You have redness, warmth, swelling, or pain at the radial site.  You have drainage (other than a small amount of blood on the dressing).  You have chills.  You have a fever or persistent symptoms for more  than 72 hours.  You have a fever and your symptoms suddenly get worse.  Your arm becomes pale, cool, tingly, or numb.  You have heavy bleeding from the site. Hold pressure on the site.   PLEASE DO NOT MISS ANY DOSES OF YOUR BRILINTA!!!!! Also keep a log of you blood pressures and bring back to your follow up appt. Please call the office with any questions.   Patients taking blood thinners should generally stay away from medicines like ibuprofen, Advil, Motrin, naproxen, and Aleve due to risk of stomach bleeding. You may take Tylenol as directed or talk to your primary doctor about alternatives.   PLEASE ENSURE THAT YOU DO NOT RUN OUT OF YOUR BRILINTA. This medication is very important to remain on for at least one year. IF you have issues obtaining this medication due to cost please CALL the office 3-5 business days prior to running out in order to prevent missing doses of this medication.   Increase activity slowly   Complete by: As directed         Discharge Medications   Allergies as of 08/02/2023       Reactions   Chlorpromazine Hcl Other (See Comments)   Decreased BP   Tramadol Palpitations   "Heart racing" and "out of body experiences"   Vicodin [hydrocodone-acetaminophen] Rash        Medication List     STOP taking these medications    cloNIDine 0.1 MG tablet Commonly known as: CATAPRES   metoprolol tartrate 50 MG tablet Commonly known as: LOPRESSOR       TAKE these medications    ALPRAZolam 1 MG tablet Commonly known as: XANAX Take 1 tablet (1 mg total) by mouth 3 (three) times daily.   aspirin EC 81 MG tablet Take 1 tablet (81 mg total) by mouth daily. Swallow whole. Start taking on: August 03, 2023   atorvastatin 80 MG tablet Commonly known as: LIPITOR Take 1 tablet (80 mg total) by mouth daily. Start taking on: August 03, 2023 What changed:  medication strength how much to take   bisacodyl 5 MG EC tablet Commonly known as: DULCOLAX Take 5  mg by mouth daily as needed for moderate constipation.   Brilinta 90 MG Tabs tablet Generic drug: ticagrelor Take 1 tablet (90 mg total) by mouth 2 (two) times daily.   butalbital-acetaminophen-caffeine 50-325-40 MG tablet Commonly known as: FIORICET Take 1 tablet by mouth daily as needed.   cyclobenzaprine 10 MG tablet Commonly known as: FLEXERIL Take 10 mg by mouth 3 (three) times daily as needed for muscle spasms.   Fish Oil 1000 MG Caps Take 1 capsule by mouth in the morning and at bedtime.   FLUoxetine 10 MG capsule Commonly known as: PROZAC Take 10 mg by mouth daily.   folic acid 1 MG tablet Commonly known as: FOLVITE Take 1 tablet (1 mg total) by mouth daily.   glipiZIDE 5 MG 24 hr tablet Commonly known as: GLUCOTROL XL Take 5 mg by mouth daily.   Jardiance 10 MG Tabs tablet Generic drug: empagliflozin Take 1 tablet (10 mg total) by mouth daily. Start  taking on: August 03, 2023   losartan 100 MG tablet Commonly known as: COZAAR Take 100 mg by mouth daily.   metoprolol succinate 50 MG 24 hr tablet Commonly known as: TOPROL-XL Take 3 tablets (150 mg total) by mouth daily. Take with or immediately following a meal. Start taking on: August 03, 2023   nitroGLYCERIN 0.4 MG SL tablet Commonly known as: NITROSTAT Place 1 tablet (0.4 mg total) under the tongue every 5 (five) minutes x 3 doses as needed for chest pain.   oxyCODONE-acetaminophen 10-325 MG tablet Commonly known as: PERCOCET Take 1 tablet by mouth 4 (four) times daily as needed.   pantoprazole 40 MG tablet Commonly known as: PROTONIX Take 40 mg by mouth daily.   sertraline 50 MG tablet Commonly known as: ZOLOFT Take 50 mg by mouth daily.        Outstanding Labs/Studies   FLP/LFTs in 8 weeks  BMET at follow up appt  Duration of Discharge Encounter   Greater than 30 minutes including physician time.  Signed, Laverda Page, NP 08/02/2023, 1:27 PM

## 2023-08-03 ENCOUNTER — Other Ambulatory Visit (HOSPITAL_COMMUNITY): Payer: Self-pay

## 2023-08-03 ENCOUNTER — Telehealth (HOSPITAL_COMMUNITY): Payer: Self-pay

## 2023-08-03 LAB — LDL CHOLESTEROL, DIRECT: Direct LDL: 69 mg/dL (ref 0–99)

## 2023-08-03 NOTE — Telephone Encounter (Signed)
Pharmacy Patient Advocate Encounter   Received notification prior authorization for Jardiance 10MG  tablets is required/requested.   Insurance verification completed.   The patient is insured through [facility] .  Prior Authorization for Jardiance 10MG  tablets has been APPROVED from 08-02-2023 to 08-01-2024. Ran test claim, Copay is $4.00. This test claim was processed through [facility]- copay amounts may vary at other pharmacies due to pharmacy/plan contracts, or as the patient moves through the different stages of their insurance plan.   PA #/Case ID/Reference #: [id number]

## 2023-08-15 ENCOUNTER — Encounter (HOSPITAL_COMMUNITY): Payer: Medicaid Other

## 2023-08-17 ENCOUNTER — Emergency Department (HOSPITAL_COMMUNITY)
Admission: EM | Admit: 2023-08-17 | Discharge: 2023-08-17 | Disposition: A | Discharge status: Home / Self Care | Payer: Medicaid Other | Attending: Emergency Medicine | Admitting: Emergency Medicine

## 2023-08-17 ENCOUNTER — Emergency Department (HOSPITAL_COMMUNITY): Payer: Medicaid Other

## 2023-08-17 ENCOUNTER — Encounter (HOSPITAL_COMMUNITY): Payer: Self-pay | Admitting: Emergency Medicine

## 2023-08-17 ENCOUNTER — Other Ambulatory Visit: Payer: Self-pay

## 2023-08-17 DIAGNOSIS — Z7982 Long term (current) use of aspirin: Secondary | ICD-10-CM | POA: Insufficient documentation

## 2023-08-17 DIAGNOSIS — I10 Essential (primary) hypertension: Secondary | ICD-10-CM | POA: Diagnosis not present

## 2023-08-17 DIAGNOSIS — R072 Precordial pain: Secondary | ICD-10-CM | POA: Diagnosis present

## 2023-08-17 DIAGNOSIS — R0602 Shortness of breath: Secondary | ICD-10-CM | POA: Insufficient documentation

## 2023-08-17 DIAGNOSIS — R079 Chest pain, unspecified: Secondary | ICD-10-CM | POA: Diagnosis not present

## 2023-08-17 DIAGNOSIS — Z79899 Other long term (current) drug therapy: Secondary | ICD-10-CM | POA: Insufficient documentation

## 2023-08-17 DIAGNOSIS — Z87891 Personal history of nicotine dependence: Secondary | ICD-10-CM | POA: Diagnosis not present

## 2023-08-17 DIAGNOSIS — I252 Old myocardial infarction: Secondary | ICD-10-CM | POA: Diagnosis not present

## 2023-08-17 DIAGNOSIS — Z7984 Long term (current) use of oral hypoglycemic drugs: Secondary | ICD-10-CM | POA: Diagnosis not present

## 2023-08-17 LAB — CBC
HCT: 44.4 % (ref 39.0–52.0)
Hemoglobin: 15.2 g/dL (ref 13.0–17.0)
MCH: 30.8 pg (ref 26.0–34.0)
MCHC: 34.2 g/dL (ref 30.0–36.0)
MCV: 90.1 fL (ref 80.0–100.0)
Platelets: 304 10*3/uL (ref 150–400)
RBC: 4.93 MIL/uL (ref 4.22–5.81)
RDW: 12.7 % (ref 11.5–15.5)
WBC: 13.7 10*3/uL — ABNORMAL HIGH (ref 4.0–10.5)
nRBC: 0 % (ref 0.0–0.2)

## 2023-08-17 LAB — BASIC METABOLIC PANEL
Anion gap: 13 (ref 5–15)
BUN: 7 mg/dL (ref 6–20)
CO2: 19 mmol/L — ABNORMAL LOW (ref 22–32)
Calcium: 9.7 mg/dL (ref 8.9–10.3)
Chloride: 103 mmol/L (ref 98–111)
Creatinine, Ser: 0.78 mg/dL (ref 0.61–1.24)
GFR, Estimated: >60 mL/min (ref >60)
Glucose, Bld: 122 mg/dL — ABNORMAL HIGH (ref 70–99)
Potassium: 4 mmol/L (ref 3.5–5.1)
Sodium: 135 mmol/L (ref 135–145)

## 2023-08-17 LAB — TROPONIN I (HIGH SENSITIVITY)
Troponin I (High Sensitivity): 11 ng/L (ref <18)
Troponin I (High Sensitivity): 9 ng/L (ref <18)

## 2023-08-17 MED ORDER — MORPHINE SULFATE (PF) 4 MG/ML IV SOLN
4.0000 mg | Freq: Once | INTRAVENOUS | Status: AC
  Administered 2023-08-17: 4 mg via INTRAVENOUS
  Filled 2023-08-17: qty 1

## 2023-08-17 MED ORDER — NITROGLYCERIN 0.4 MG SL SUBL
0.4000 mg | SUBLINGUAL_TABLET | SUBLINGUAL | Status: DC | PRN
  Administered 2023-08-17: 0.4 mg via SUBLINGUAL
  Filled 2023-08-17: qty 1

## 2023-08-17 MED ORDER — IOHEXOL 350 MG/ML SOLN
100.0000 mL | Freq: Once | INTRAVENOUS | Status: AC | PRN
  Administered 2023-08-17: 100 mL via INTRAVENOUS

## 2023-08-17 MED ORDER — ALPRAZOLAM 0.25 MG PO TABS
1.0000 mg | ORAL_TABLET | Freq: Once | ORAL | Status: AC
  Administered 2023-08-17: 1 mg via ORAL
  Filled 2023-08-17: qty 4

## 2023-08-17 NOTE — ED Triage Notes (Signed)
Pt BIB [facility] from home for 10/10 CP and SOB about 2 hrs ago.  Hx of MI 2-3 weeks ago. PT recently started taking Metoprolol and has some issues with hypotension at home but not for EMS  Took nitroglycerin and ASA at home.  20G L AC  145/78 HR 76 99%
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
## 2023-08-17 NOTE — ED Provider Notes (Signed)
Eric [name] EMERGENCY DEPARTMENT AT [facility] Provider Note   CSN: [id number] Arrival date & time: 08/17/23  1849     History {Add pertinent medical, surgical, social history, OB history to HPI:1} No chief complaint on file.   Eric [name] is a 52 y.o. male.  He has a history of hypertension chronic pain anxiety.  He was in the hospital few weeks ago for chest pain STEMI.  He had multiple stents to his RCA.  He said he did well after discharge and stop smoking but for the last few days he has not felt well.  Today began experiencing severe pain in his chest tightness pressure associated with shortness of breath  The history is provided by the patient.  Chest Pain Pain location:  Substernal area Pain quality: aching   Pain severity:  Severe Onset quality:  Gradual Duration:  1 day Timing:  Constant Progression:  Unchanged Chronicity:  Recurrent Relieved by:  None tried Worsened by:  Nothing Ineffective treatments:  None tried Associated symptoms: shortness of breath   Associated symptoms: no abdominal pain, no cough, no fever and no vomiting        Home Medications Prior to Admission medications   Medication Sig Start Date End Date Taking? Authorizing Provider  ALPRAZolam Prudy Feeler) 1 MG tablet Take 1 tablet (1 mg total) by mouth 3 (three) times daily. 06/15/23   Sherryll Burger, Pratik D, DO  aspirin EC 81 MG tablet Take 1 tablet (81 mg total) by mouth daily. Swallow whole. 08/03/23   Arty Baumgartner, NP  atorvastatin (LIPITOR) 80 MG tablet Take 1 tablet (80 mg total) by mouth daily. 08/03/23   Arty Baumgartner, NP  bisacodyl (DULCOLAX) 5 MG EC tablet Take 5 mg by mouth daily as needed for moderate constipation.    [provider]  butalbital-acetaminophen-caffeine (FIORICET) 50-325-40 MG tablet Take 1 tablet by mouth daily as needed. 05/21/23   [provider]  cyclobenzaprine (FLEXERIL) 10 MG tablet Take 10 mg by mouth 3 (three) times daily as  needed for muscle spasms.    [provider]  empagliflozin (JARDIANCE) 10 MG TABS tablet Take 1 tablet (10 mg total) by mouth daily. 08/03/23   Arty Baumgartner, NP  FLUoxetine (PROZAC) 10 MG capsule Take 10 mg by mouth daily. 07/13/23   [provider]  folic acid (FOLVITE) 1 MG tablet Take 1 tablet (1 mg total) by mouth daily. 05/23/23 05/22/24  Tyrone Nine, MD  glipiZIDE (GLUCOTROL XL) 5 MG 24 hr tablet Take 5 mg by mouth daily. 07/30/23   [provider]  losartan (COZAAR) 100 MG tablet Take 100 mg by mouth daily. 07/13/23   [provider]  metoprolol succinate (TOPROL-XL) 50 MG 24 hr tablet Take 3 tablets (150 mg total) by mouth daily. Take with or immediately following a meal. 08/03/23   Arty Baumgartner, NP  nitroGLYCERIN (NITROSTAT) 0.4 MG SL tablet Place 1 tablet (0.4 mg total) under the tongue every 5 (five) minutes x 3 doses as needed for chest pain. 08/02/23   Arty Baumgartner, NP  Omega-3 Fatty Acids (FISH OIL) 1000 MG CAPS Take 1 capsule by mouth in the morning and at bedtime.    [provider]  oxyCODONE-acetaminophen (PERCOCET) 10-325 MG tablet Take 1 tablet by mouth 4 (four) times daily as needed. 04/25/23   [provider]  pantoprazole (PROTONIX) 40 MG tablet Take 40 mg by mouth daily. 03/21/23   [provider]  sertraline (  ZOLOFT) 50 MG tablet Take 50 mg by mouth daily. 06/20/23   [provider]  ticagrelor (BRILINTA) 90 MG TABS tablet Take 1 tablet (90 mg total) by mouth 2 (two) times daily. 08/02/23   Arty Baumgartner, NP      Allergies    Chlorpromazine hcl, Tramadol, and Vicodin [hydrocodone-acetaminophen]    Review of Systems   Review of Systems  Constitutional:  Negative for fever.  Eyes:  Negative for visual disturbance.  Respiratory:  Positive for shortness of breath. Negative for cough.   Cardiovascular:  Positive for chest pain.  Gastrointestinal:  Negative for abdominal pain and  vomiting.    Physical Exam Updated Vital Signs BP (!) 157/97 (BP Location: Left Arm)   Pulse 76   Temp 97.6 F (36.4 C) (Oral)   Resp (!) 32   SpO2 100%  Physical Exam Vitals and nursing note reviewed.  Constitutional:      General: He is in acute distress.     Appearance: Normal appearance. He is well-developed.  HENT:     Head: Normocephalic and atraumatic.  Eyes:     Conjunctiva/sclera: Conjunctivae normal.  Cardiovascular:     Rate and Rhythm: Normal rate and regular rhythm.     Heart sounds: No murmur heard. Pulmonary:     Effort: Pulmonary effort is normal. Tachypnea present. No respiratory distress.     Breath sounds: Normal breath sounds.  Abdominal:     Palpations: Abdomen is soft.     Tenderness: There is no abdominal tenderness. There is no guarding or rebound.  Musculoskeletal:        General: Normal range of motion.     Cervical back: Neck supple.     Right lower leg: No edema.     Left lower leg: No edema.  Skin:    General: Skin is warm and dry.     Capillary Refill: Capillary refill takes less than 2 seconds.  Neurological:     General: No focal deficit present.     Mental Status: He is alert.     ED Results / Procedures / Treatments   Labs (all labs ordered are listed, but only abnormal results are displayed) Labs Reviewed  CBC - Abnormal; Notable for the following components:      Result Value   WBC 13.7 (*)    All other components within normal limits  BASIC METABOLIC PANEL  TROPONIN I (HIGH SENSITIVITY)    EKG EKG Interpretation Date/Time:  Friday August 17 2023 19:05:30 EST Ventricular Rate:  77 PR Interval:  162 QRS Duration:  96 QT Interval:  384 QTC Calculation: 434 R Axis:   -29  Text Interpretation: Normal sinus rhythm Inferior infarct , age undetermined Abnormal ECG flipped t wave in lead III and avF seen on prior No significant change since last tracing Confirmed by Melene Plan [phone]) on 08/17/2023 7:07:41  PM  Radiology No results found.  Procedures Procedures  {Document cardiac monitor, telemetry assessment procedure when appropriate:1}  Medications Ordered in ED Medications  nitroGLYCERIN (NITROSTAT) SL tablet 0.4 mg (has no administration in time range)  morphine (PF) 4 MG/ML injection 4 mg (has no administration in time range)    ED Course/ Medical Decision Making/ A&P   {   Click here for ABCD2, HEART and other calculatorsREFRESH Note before signing :1}  Medical Decision Making Amount and/or Complexity of Data Reviewed Labs: ordered. Radiology: ordered.  Risk Prescription drug management.   This patient complains of ***; this involves an extensive number of treatment Options and is a complaint that carries with it a high risk of complications and morbidity. The differential includes ***  I ordered, reviewed and interpreted labs, which included *** I ordered medication *** and reviewed PMP when indicated. I ordered imaging studies which included *** and I independently    visualized and interpreted imaging which showed *** Additional history obtained from *** Previous records obtained and reviewed *** I consulted *** and discussed lab and imaging findings and discussed disposition.  Cardiac monitoring reviewed, *** Social determinants considered, *** Critical Interventions: ***  After the interventions stated above, I reevaluated the patient and found *** Admission and further testing considered, ***   {Document critical care time when appropriate:1} {Document review of labs and clinical decision tools ie heart score, Chads2Vasc2 etc:1}  {Document your independent review of radiology images, and any outside records:1} {Document your discussion with family members, caretakers, and with consultants:1} {Document social determinants of health affecting pt's care:1} {Document your decision making why or why not admission, treatments were  needed:1} Final Clinical Impression(s) / ED Diagnoses Final diagnoses:  None    Rx / DC Orders ED Discharge Orders     None

## 2023-08-17 NOTE — ED Notes (Signed)
Admit Provider at bedside. 

## 2023-08-17 NOTE — Discharge Instructions (Signed)
You were seen in the emergency department for worsening chest pain.  You had blood work EKG and a CAT scan of your chest that did not show any evidence of heart attack or other serious causes of your symptoms.  Please continue your regular medications and follow-up with a cardiologist outpatient as scheduled.  Return if any worsening or concerning symptoms

## 2023-08-27 ENCOUNTER — Encounter: Payer: Self-pay | Admitting: Emergency Medicine

## 2023-08-27 ENCOUNTER — Ambulatory Visit: Payer: Medicaid Other | Attending: Nurse Practitioner | Admitting: Emergency Medicine

## 2023-08-27 ENCOUNTER — Other Ambulatory Visit: Payer: Self-pay

## 2023-08-27 VITALS — BP 132/90 | HR 70 | Ht 72.0 in | Wt 219.4 lb

## 2023-08-27 DIAGNOSIS — I5020 Unspecified systolic (congestive) heart failure: Secondary | ICD-10-CM

## 2023-08-27 DIAGNOSIS — Z720 Tobacco use: Secondary | ICD-10-CM

## 2023-08-27 DIAGNOSIS — I255 Ischemic cardiomyopathy: Secondary | ICD-10-CM | POA: Diagnosis not present

## 2023-08-27 DIAGNOSIS — E785 Hyperlipidemia, unspecified: Secondary | ICD-10-CM

## 2023-08-27 DIAGNOSIS — I2111 ST elevation (STEMI) myocardial infarction involving right coronary artery: Secondary | ICD-10-CM

## 2023-08-27 DIAGNOSIS — E118 Type 2 diabetes mellitus with unspecified complications: Secondary | ICD-10-CM

## 2023-08-27 MED ORDER — EMPAGLIFLOZIN 10 MG PO TABS: 10.0000 mg | ORAL_TABLET | Freq: Every day | ORAL | 3 refills | Status: DC

## 2023-08-27 MED ORDER — METOPROLOL SUCCINATE ER 100 MG PO TB24: 100.0000 mg | ORAL_TABLET | Freq: Every day | ORAL | 3 refills | Status: DC

## 2023-08-27 MED ORDER — SPIRONOLACTONE 25 MG PO TABS: 12.5000 mg | ORAL_TABLET | Freq: Every day | ORAL | 3 refills | Status: DC
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
## 2023-08-27 NOTE — Patient Instructions (Addendum)
Medication Instructions:  Start Spironolactone 12.5 mg daily Decrease Metoprolol Succinate 100 mg daily. Please Stop Metoprolol Succinate 150 mg daily.  *If you need a refill on your cardiac medications before your next appointment, please call your pharmacy*   Lab Work: BMET today & BMET in 2 weeks.    Testing/Procedures: Your physician has requested that you have an echocardiogram March 2025. Echocardiography is a painless test that uses sound waves to create images of your heart. It provides your doctor with information about the size and shape of your heart and how well your heart's chambers and valves are working. This procedure takes approximately one hour. There are no restrictions for this procedure. Please do NOT wear cologne, perfume, aftershave, or lotions (deodorant is allowed). Please arrive 15 minutes prior to your appointment time.  Please note: We ask at that you not bring children with you during ultrasound (echo/ vascular) testing. Due to room size and safety concerns, children are not allowed in the ultrasound rooms during exams. Our front office staff cannot provide observation of children in our lobby area while testing is being conducted. An adult accompanying a patient to their appointment will only be allowed in the ultrasound room at the discretion of the ultrasound technician under special circumstances. We apologize for any inconvenience.     Follow-Up: At [facility], you and your health needs are our priority.  As part of our continuing mission to provide you with exceptional heart care, we have created designated Provider Care Teams.  These Care Teams include your primary Cardiologist (physician) and Advanced Practice Providers (APPs -  Physician Assistants and Nurse Practitioners) who all work together to provide you with the care you need, when you need it.  We recommend signing up for the patient portal called "MyChart".  Sign up information is  provided on this After Visit Summary.  MyChart is used to connect with patients for Virtual Visits (Telemedicine).  Patients are able to view lab/test results, encounter notes, upcoming appointments, etc.  Non-urgent messages can be sent to your provider as well.   To learn more about what you can do with MyChart, go to [email or web address].    Your next appointment:    3 months (appointment after ECHO)  Provider:   Peter Swaziland, MD  or Bernadene Person, NP        Other Instructions

## 2023-08-28 ENCOUNTER — Encounter (HOSPITAL_COMMUNITY): Payer: Medicaid Other

## 2023-08-28 ENCOUNTER — Encounter (HOSPITAL_COMMUNITY): Payer: Self-pay

## 2023-08-28 LAB — BASIC METABOLIC PANEL
BUN/Creatinine Ratio: 13 (ref 9–20)
BUN: 10 mg/dL (ref 6–24)
CO2: 24 mmol/L (ref 20–29)
Calcium: 10 mg/dL (ref 8.7–10.2)
Chloride: 103 mmol/L (ref 96–106)
Creatinine, Ser: 0.75 mg/dL — ABNORMAL LOW (ref 0.76–1.27)
Glucose: 115 mg/dL — ABNORMAL HIGH (ref 70–99)
Potassium: 4.6 mmol/L (ref 3.5–5.2)
Sodium: 141 mmol/L (ref 134–144)
eGFR: 109 mL/min/{1.73_m2} (ref >59)

## 2023-09-04 ENCOUNTER — Telehealth: Payer: Self-pay

## 2023-09-04 NOTE — Telephone Encounter (Signed)
 Lab results reviewed by pt via mychart.

## 2023-09-12 ENCOUNTER — Other Ambulatory Visit: Payer: Self-pay

## 2023-09-12 ENCOUNTER — Emergency Department (HOSPITAL_COMMUNITY): Payer: Medicaid Other

## 2023-09-12 ENCOUNTER — Encounter (HOSPITAL_COMMUNITY): Payer: Self-pay

## 2023-09-12 ENCOUNTER — Emergency Department (HOSPITAL_COMMUNITY)
Admission: EM | Admit: 2023-09-12 | Discharge: 2023-09-12 | Disposition: A | Discharge status: Home / Self Care | Payer: Medicaid Other | Attending: Emergency Medicine | Admitting: Emergency Medicine

## 2023-09-12 DIAGNOSIS — Z7982 Long term (current) use of aspirin: Secondary | ICD-10-CM | POA: Insufficient documentation

## 2023-09-12 DIAGNOSIS — R42 Dizziness and giddiness: Secondary | ICD-10-CM | POA: Insufficient documentation

## 2023-09-12 DIAGNOSIS — R079 Chest pain, unspecified: Secondary | ICD-10-CM | POA: Diagnosis present

## 2023-09-12 LAB — CBC
HCT: 44 % (ref 39.0–52.0)
Hemoglobin: 14.9 g/dL (ref 13.0–17.0)
MCH: 30.7 pg (ref 26.0–34.0)
MCHC: 33.9 g/dL (ref 30.0–36.0)
MCV: 90.7 fL (ref 80.0–100.0)
Platelets: 265 10*3/uL (ref 150–400)
RBC: 4.85 MIL/uL (ref 4.22–5.81)
RDW: 12.9 % (ref 11.5–15.5)
WBC: 11.5 10*3/uL — ABNORMAL HIGH (ref 4.0–10.5)
nRBC: 0 % (ref 0.0–0.2)

## 2023-09-12 LAB — BASIC METABOLIC PANEL
Anion gap: 9 (ref 5–15)
BUN: 8 mg/dL (ref 6–20)
CO2: 23 mmol/L (ref 22–32)
Calcium: 9.7 mg/dL (ref 8.9–10.3)
Chloride: 104 mmol/L (ref 98–111)
Creatinine, Ser: 0.63 mg/dL (ref 0.61–1.24)
GFR, Estimated: >60 mL/min (ref >60)
Glucose, Bld: 116 mg/dL — ABNORMAL HIGH (ref 70–99)
Potassium: 4.2 mmol/L (ref 3.5–5.1)
Sodium: 136 mmol/L (ref 135–145)

## 2023-09-12 LAB — TROPONIN I (HIGH SENSITIVITY)
Troponin I (High Sensitivity): 17 ng/L (ref <18)
Troponin I (High Sensitivity): 15 ng/L (ref <18)

## 2023-09-12 MED ORDER — HYDROMORPHONE HCL 1 MG/ML IJ SOLN
1.0000 mg | Freq: Once | INTRAMUSCULAR | Status: AC
  Administered 2023-09-12: 1 mg via INTRAVENOUS
  Filled 2023-09-12: qty 1

## 2023-09-12 MED ORDER — FENTANYL CITRATE PF 50 MCG/ML IJ SOSY
50.0000 ug | PREFILLED_SYRINGE | Freq: Once | INTRAMUSCULAR | Status: AC
  Administered 2023-09-12: 50 ug via INTRAVENOUS
  Filled 2023-09-12: qty 1

## 2023-09-12 MED ORDER — NITROGLYCERIN 0.4 MG SL SUBL
0.4000 mg | SUBLINGUAL_TABLET | SUBLINGUAL | Status: DC | PRN
  Administered 2023-09-12: 0.4 mg via SUBLINGUAL
  Filled 2023-09-12: qty 1

## 2023-09-12 MED ORDER — ALPRAZOLAM 0.5 MG PO TABS
1.0000 mg | ORAL_TABLET | Freq: Once | ORAL | Status: AC
  Administered 2023-09-12: 1 mg via ORAL
  Filled 2023-09-12: qty 2

## 2023-09-12 NOTE — ED Triage Notes (Signed)
 Complaining of chest pain that started at 7:30 feels like someone is squeezing him. Eric [name] it feels similar to what it did a month ago when he had his heart attack.

## 2023-09-12 NOTE — ED Provider Notes (Signed)
 [place] [facility] AT [facility] Provider Note   CSN: [id number] Arrival date & time: 09/12/23  0000     History  Chief Complaint  Patient presents with   Chest Pain    Eric [name] is a 53 y.o. male.  53 year old male with a history of anxiety and panic attacks but also had a STEMI back in December that presents ER today with chest pressure.  Patient states it feels similar to when he had a heart attack before but also wonders if it could be anxiety.  States that it was off-and-on throughout the day yesterday but then persisted throughout the night tonight so he came here for further evaluation.  States he feels a bit lightheaded when he stands up.  Has had some fast breathing with it as well.  No diaphoresis, dyspnea, emesis or syncope.  Not radiating well.  No lower extremity swelling.  Compliant with medications.  81 mg of aspirin  at home did not help.   Chest Pain      Home Medications Prior to Admission medications   Medication Sig Start Date End Date Taking? Authorizing Provider  ALPRAZolam  (XANAX ) 1 MG tablet Take 1 tablet (1 mg total) by mouth 3 (three) times daily. 06/15/23   Mason Sole, Pratik D, DO  aspirin  EC 81 MG tablet Take 1 tablet (81 mg total) by mouth daily. Swallow whole. 08/03/23   Sanjuanita Cruz, NP  atorvastatin  (LIPITOR) 80 MG tablet Take 1 tablet (80 mg total) by mouth daily. 08/03/23   Sanjuanita Cruz, NP  bisacodyl (DULCOLAX) 5 MG EC tablet Take 5 mg by mouth daily as needed for moderate constipation.    [provider]  butalbital -acetaminophen -caffeine  (FIORICET ) 50-325-40 MG tablet Take 1 tablet by mouth daily as needed. 05/21/23   [provider]  cyclobenzaprine  (FLEXERIL ) 10 MG tablet Take 10 mg by mouth 3 (three) times daily as needed for muscle spasms.    [provider]  empagliflozin  (JARDIANCE ) 10 MG TABS tablet Take 1 tablet (10 mg total) by mouth daily. 08/27/23   Ava Boatman, NP   FLUoxetine  (PROZAC ) 10 MG capsule Take 10 mg by mouth daily. 07/13/23   [provider]  folic acid  (FOLVITE ) 1 MG tablet Take 1 tablet (1 mg total) by mouth daily. 05/23/23 05/22/24  Wynetta Heckle, MD  glipiZIDE (GLUCOTROL XL) 5 MG 24 hr tablet Take 5 mg by mouth daily. 07/30/23   [provider]  losartan  (COZAAR ) 100 MG tablet Take 100 mg by mouth daily. 07/13/23   [provider]  metoprolol  succinate (TOPROL  XL) 100 MG 24 hr tablet Take 1 tablet (100 mg total) by mouth daily. Take with or immediately following a meal. 08/27/23   Fountain, Madison L, NP  nitroGLYCERIN  (NITROSTAT ) 0.4 MG SL tablet Place 1 tablet (0.4 mg total) under the tongue every 5 (five) minutes x 3 doses as needed for chest pain. 08/02/23   Sanjuanita Cruz, NP  Omega-3 Fatty Acids (FISH OIL) 1000 MG CAPS Take 1 capsule by mouth in the morning and at bedtime.    [provider]  oxyCODONE -acetaminophen  (PERCOCET) 10-325 MG tablet Take 1 tablet by mouth 4 (four) times daily as needed. 04/25/23   [provider]  pantoprazole  (PROTONIX ) 40 MG tablet Take 40 mg by mouth daily. 03/21/23   [provider]  sertraline (ZOLOFT) 50 MG tablet Take 50 mg by mouth daily. 06/20/23   [provider]  spironolactone  (ALDACTONE ) 25 MG  tablet Take 0.5 tablets (12.5 mg total) by mouth daily. 08/27/23 11/25/23  Ava Boatman, NP  ticagrelor  (BRILINTA ) 90 MG TABS tablet Take 1 tablet (90 mg total) by mouth 2 (two) times daily. 08/02/23   Sanjuanita Cruz, NP      Allergies    Chlorpromazine hcl, Tramadol, and Vicodin [hydrocodone-acetaminophen ]    Review of Systems   Review of Systems  Cardiovascular:  Positive for chest pain.    Physical Exam Updated Vital Signs BP (!) 150/97   Pulse 84   Temp (!) 97.5 F (36.4 C) (Oral)   Resp (!) 25   Ht 6' (1.829 m)   Wt 99.3 kg   SpO2 99%   BMI 29.70 kg/m  Physical Exam Vitals and nursing note reviewed.  Constitutional:       Appearance: He is well-developed.  HENT:     Head: Normocephalic and atraumatic.  Cardiovascular:     Rate and Rhythm: Normal rate.  Pulmonary:     Effort: Pulmonary effort is normal. No respiratory distress.  Abdominal:     General: There is no distension.     Palpations: Abdomen is soft.  Musculoskeletal:        General: Normal range of motion.     Cervical back: Normal range of motion.  Neurological:     Mental Status: He is alert.     Comments: Mildly tremulous.     ED Results / Procedures / Treatments   Labs (all labs ordered are listed, but only abnormal results are displayed) Labs Reviewed  BASIC METABOLIC PANEL - Abnormal; Notable for the following components:      Result Value   Glucose, Bld 116 (*)    All other components within normal limits  CBC - Abnormal; Notable for the following components:   WBC 11.5 (*)    All other components within normal limits  TROPONIN I (HIGH SENSITIVITY)  TROPONIN I (HIGH SENSITIVITY)    EKG EKG Interpretation Date/Time:  Wednesday September 12 2023 00:13:01 EST Ventricular Rate:  63 PR Interval:  166 QRS Duration:  99 QT Interval:  385 QTC Calculation: 395 R Axis:   -49  Text Interpretation: Sinus rhythm Inferior infarct, age indeterminate Confirmed by Eve Hinders [phone]) on 09/12/2023 12:19:59 AM  Radiology DG Chest 2 View Result Date: 09/12/2023 CLINICAL DATA:  Chest pain EXAM: CHEST - 2 VIEW COMPARISON:  08/17/2023 FINDINGS: Lungs are clear.  No pleural effusion or pneumothorax. Mild eventration of the right hemidiaphragm. The heart is normal in size. Mild degenerative changes of the visualized thoracolumbar spine. IMPRESSION: Normal chest radiographs. Electronically Signed   By: Zadie Herter M.D.   On: 09/12/2023 01:27    Procedures Procedures    Medications Ordered in ED Medications  nitroGLYCERIN  (NITROSTAT ) SL tablet 0.4 mg (0.4 mg Sublingual Given 09/12/23 0115)  fentaNYL  (SUBLIMAZE ) injection 50 mcg  (50 mcg Intravenous Given 09/12/23 0115)  ALPRAZolam  (XANAX ) tablet 1 mg (1 mg Oral Given 09/12/23 0156)  HYDROmorphone  (DILAUDID ) injection 1 mg (1 mg Intravenous Given 09/12/23 6045)    ED Course/ Medical Decision Making/ A&P                                 Medical Decision Making Amount and/or Complexity of Data Reviewed Labs: ordered. Radiology: ordered.  Risk Prescription drug management.   EKG similar to previous.  Not normal but no evidence of ST elevation and troponin is normal  making ACS unlikely.  Low suspicion for PE at this time.  Symptoms improved with Xanax .  Patient wonders if this could been a panic attack.  I discussed I cannot necessarily diagnose that in the ER however felt similar to that it could be.  Will follow-up with PCP for further management of anxiety and questions regarding changes medications.  Will follow-up with his cardiologist for the chest pain.  Vital signs stable.  Discharged to care of his wife.   Final Clinical Impression(s) / ED Diagnoses Final diagnoses:  Nonspecific chest pain    Rx / DC Orders ED Discharge Orders     None         Junnie Loschiavo, Reymundo Caulk, MD 09/12/23 [phone]

## 2023-09-27 LAB — EXTERNAL GENERIC LAB PROCEDURE: COLOGUARD: POSITIVE — AB

## 2023-09-27 LAB — COLOGUARD: COLOGUARD: POSITIVE — AB

## 2023-10-02 ENCOUNTER — Encounter: Payer: Self-pay | Admitting: Gastroenterology

## 2023-10-16 ENCOUNTER — Other Ambulatory Visit: Payer: Self-pay

## 2023-10-16 ENCOUNTER — Telehealth: Payer: Self-pay | Admitting: Emergency Medicine

## 2023-10-16 ENCOUNTER — Other Ambulatory Visit (HOSPITAL_COMMUNITY): Payer: Self-pay

## 2023-10-16 MED ORDER — TICAGRELOR 90 MG PO TABS: 90.0000 mg | ORAL_TABLET | Freq: Two times a day (BID) | ORAL | 3 refills | Status: DC

## 2023-10-16 NOTE — Telephone Encounter (Signed)
*  STAT* If patient is at the pharmacy, call can be transferred to refill team.   1. Which medications need to be refilled? (please list name of each medication and dose if known) ticagrelor (BRILINTA) 90 MG TABS tablet  2. Which pharmacy/location (including street and city if local pharmacy) is medication to be sent to? [facility] - [place], [place] - [address]  3. Do they need a 30 day or 90 day supply?   90 day supply  Patient is almost out of medication.

## 2023-10-19 ENCOUNTER — Ambulatory Visit (INDEPENDENT_AMBULATORY_CARE_PROVIDER_SITE_OTHER): Payer: Medicaid Other | Admitting: Urology

## 2023-10-19 VITALS — BP 109/72 | HR 87

## 2023-10-19 DIAGNOSIS — N486 Induration penis plastica: Secondary | ICD-10-CM | POA: Diagnosis not present
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
## 2023-10-23 ENCOUNTER — Encounter: Payer: Self-pay | Admitting: Urology

## 2023-11-02 ENCOUNTER — Ambulatory Visit (HOSPITAL_COMMUNITY): Admission: RE | Admit: 2023-11-02 | Payer: Medicaid Other | Source: Ambulatory Visit

## 2023-11-07 ENCOUNTER — Encounter: Payer: Self-pay | Admitting: Gastroenterology

## 2023-11-07 ENCOUNTER — Ambulatory Visit (INDEPENDENT_AMBULATORY_CARE_PROVIDER_SITE_OTHER): Payer: Medicaid Other | Admitting: Gastroenterology

## 2023-11-07 VITALS — BP 117/77 | HR 80 | Temp 98.1°F | Ht 72.0 in | Wt 212.0 lb

## 2023-11-07 DIAGNOSIS — R195 Other fecal abnormalities: Secondary | ICD-10-CM | POA: Insufficient documentation

## 2023-11-07 NOTE — Patient Instructions (Signed)
 We will be in touch regarding timing of colonoscopy in light of your recent cardiac stents. I anticipate having to wait until six months after your stents were placed. If you notice any change in bowels, bleeding, etc please let us know.
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
## 2023-11-08 ENCOUNTER — Encounter: Payer: Self-pay | Admitting: Gastroenterology

## 2023-11-16 ENCOUNTER — Ambulatory Visit: Payer: Medicaid Other | Admitting: Cardiology

## 2023-11-22 ENCOUNTER — Emergency Department (HOSPITAL_COMMUNITY)

## 2023-11-22 ENCOUNTER — Encounter (HOSPITAL_COMMUNITY): Payer: Self-pay

## 2023-11-22 ENCOUNTER — Other Ambulatory Visit: Payer: Self-pay

## 2023-11-22 ENCOUNTER — Emergency Department (HOSPITAL_COMMUNITY)
Admission: EM | Admit: 2023-11-22 | Discharge: 2023-11-22 | Disposition: A | Discharge status: Home / Self Care | Attending: Emergency Medicine | Admitting: Emergency Medicine

## 2023-11-22 DIAGNOSIS — Z7982 Long term (current) use of aspirin: Secondary | ICD-10-CM | POA: Insufficient documentation

## 2023-11-22 DIAGNOSIS — R072 Precordial pain: Secondary | ICD-10-CM | POA: Insufficient documentation

## 2023-11-22 DIAGNOSIS — E119 Type 2 diabetes mellitus without complications: Secondary | ICD-10-CM | POA: Insufficient documentation

## 2023-11-22 DIAGNOSIS — R42 Dizziness and giddiness: Secondary | ICD-10-CM | POA: Diagnosis not present

## 2023-11-22 DIAGNOSIS — R079 Chest pain, unspecified: Secondary | ICD-10-CM

## 2023-11-22 DIAGNOSIS — F17200 Nicotine dependence, unspecified, uncomplicated: Secondary | ICD-10-CM | POA: Insufficient documentation

## 2023-11-22 DIAGNOSIS — Z7984 Long term (current) use of oral hypoglycemic drugs: Secondary | ICD-10-CM | POA: Insufficient documentation

## 2023-11-22 HISTORY — DX: Acute myocardial infarction, unspecified: I21.9

## 2023-11-22 LAB — CBC
HCT: 40.7 % (ref 39.0–52.0)
Hemoglobin: 13.7 g/dL (ref 13.0–17.0)
MCH: 30.9 pg (ref 26.0–34.0)
MCHC: 33.7 g/dL (ref 30.0–36.0)
MCV: 91.7 fL (ref 80.0–100.0)
Platelets: 253 10*3/uL (ref 150–400)
RBC: 4.44 MIL/uL (ref 4.22–5.81)
RDW: 12.6 % (ref 11.5–15.5)
WBC: 9.5 10*3/uL (ref 4.0–10.5)
nRBC: 0 % (ref 0.0–0.2)

## 2023-11-22 LAB — TROPONIN I (HIGH SENSITIVITY)
Troponin I (High Sensitivity): 6 ng/L (ref <18)
Troponin I (High Sensitivity): 7 ng/L (ref <18)

## 2023-11-22 LAB — BASIC METABOLIC PANEL WITH GFR
Anion gap: 8 (ref 5–15)
BUN: 9 mg/dL (ref 6–20)
CO2: 23 mmol/L (ref 22–32)
Calcium: 9.4 mg/dL (ref 8.9–10.3)
Chloride: 104 mmol/L (ref 98–111)
Creatinine, Ser: 0.72 mg/dL (ref 0.61–1.24)
GFR, Estimated: >60 mL/min (ref >60)
Glucose, Bld: 127 mg/dL — ABNORMAL HIGH (ref 70–99)
Potassium: 3.5 mmol/L (ref 3.5–5.1)
Sodium: 135 mmol/L (ref 135–145)

## 2023-11-22 MED ORDER — NITROGLYCERIN 0.4 MG SL SUBL
0.4000 mg | SUBLINGUAL_TABLET | SUBLINGUAL | Status: DC | PRN
  Administered 2023-11-22: 0.4 mg via SUBLINGUAL
  Filled 2023-11-22: qty 1

## 2023-11-22 MED ORDER — ASPIRIN 81 MG PO CHEW
162.0000 mg | CHEWABLE_TABLET | Freq: Once | ORAL | Status: AC
  Administered 2023-11-22: 162 mg via ORAL
  Filled 2023-11-22: qty 2

## 2023-11-22 NOTE — ED Provider Notes (Signed)
 [place] EMERGENCY DEPARTMENT AT [facility] Provider Note   CSN: [id number] Arrival date & time: 11/22/23  1840     History  Chief Complaint  Patient presents with   Chest Pain    Eric [name] is a 53 y.o. male.  53 year old male with a history of diabetes, hyperlipidemia, prior tobacco use, and STEMI status post PCI in December 2024 who presents to the emergency department with chest pain.  Patient reports that over the past few days she has had substernal chest pain.  It is a squeezing sensation.  Radiates to his right arm.  Currently 7/10 in severity.  Does not worsen with exertion.  No diaphoresis or vomiting.  Unsure if it feels similar to his heart attack.  Has had some mild dizziness with it.  Tried an expired nitroglycerin but says that it did not change his pain.       Home Medications Prior to Admission medications   Medication Sig Start Date End Date Taking? Authorizing Provider  ALPRAZolam Prudy Feeler) 1 MG tablet Take 1 tablet (1 mg total) by mouth 3 (three) times daily. 06/15/23  Yes Shah, Pratik D, DO  aspirin EC 81 MG tablet Take 1 tablet (81 mg total) by mouth daily. Swallow whole. 08/03/23  Yes Arty Baumgartner, NP  atorvastatin (LIPITOR) 80 MG tablet Take 1 tablet (80 mg total) by mouth daily. 08/03/23  Yes Arty Baumgartner, NP  butalbital-acetaminophen-caffeine (FIORICET) [phone] MG tablet Take 1 tablet by mouth daily as needed for headache or migraine. 05/21/23  Yes [provider]  cyclobenzaprine (FLEXERIL) 10 MG tablet Take 10 mg by mouth 3 (three) times daily as needed for muscle spasms.   Yes [provider]  empagliflozin (JARDIANCE) 10 MG TABS tablet Take 1 tablet (10 mg total) by mouth daily. 08/27/23  Yes Fountain, Madison L, NP  FLUoxetine (PROZAC) 10 MG capsule Take 10 mg by mouth daily. 07/13/23  Yes [provider]  folic acid (FOLVITE) 1 MG tablet Take 1 tablet (1 mg total) by mouth daily. 05/23/23 05/22/24  Yes Tyrone Nine, MD  glipiZIDE (GLUCOTROL XL) 5 MG 24 hr tablet Take 5 mg by mouth daily. 07/30/23  Yes [provider]  losartan (COZAAR) 100 MG tablet Take 100 mg by mouth daily. 07/13/23  Yes [provider]  metoprolol succinate (TOPROL XL) 100 MG 24 hr tablet Take 1 tablet (100 mg total) by mouth daily. Take with or immediately following a meal. 08/27/23  Yes Fountain, Madison L, NP  nitroGLYCERIN (NITROSTAT) 0.4 MG SL tablet Place 1 tablet (0.4 mg total) under the tongue every 5 (five) minutes x 3 doses as needed for chest pain. 08/02/23  Yes Arty Baumgartner, NP  Omega-3 Fatty Acids (FISH OIL) 1000 MG CAPS Take 1 capsule by mouth in the morning and at bedtime.   Yes [provider]  oxyCODONE-acetaminophen (PERCOCET) 10-325 MG tablet Take 1 tablet by mouth 4 (four) times daily as needed for pain. 04/25/23  Yes [provider]  pantoprazole (PROTONIX) 40 MG tablet Take 40 mg by mouth daily. 03/21/23  Yes [provider]  spironolactone (ALDACTONE) 25 MG tablet Take 0.5 tablets (12.5 mg total) by mouth daily. 08/27/23 11/25/23 Yes Fountain, Madison L, NP  ticagrelor (BRILINTA) 90 MG TABS tablet Take 1 tablet (90 mg total) by mouth 2 (two) times daily. 10/16/23  Yes Fountain, Madison L, NP      Allergies    Chlorpromazine hcl, Tramadol, and Vicodin [hydrocodone-acetaminophen]  Review of Systems   Review of Systems  Physical Exam Updated Vital Signs BP (!) 144/75   Pulse (!) 59   Temp 97.7 F (36.5 C)   Resp 15   Ht 6' (1.829 m)   Wt 90.7 kg   SpO2 99%   BMI 27.12 kg/m  Physical Exam Vitals and nursing note reviewed.  Constitutional:      General: He is not in acute distress.    Appearance: He is well-developed.  HENT:     Head: Normocephalic and atraumatic.     Right Ear: External ear normal.     Left Ear: External ear normal.     Nose: Nose normal.  Eyes:     Extraocular Movements: Extraocular movements intact.      Conjunctiva/sclera: Conjunctivae normal.     Pupils: Pupils are equal, round, and reactive to light.  Cardiovascular:     Rate and Rhythm: Normal rate and regular rhythm.     Heart sounds: Normal heart sounds.     Comments: Radial pulses 2+ bilaterally Pulmonary:     Effort: Pulmonary effort is normal. No respiratory distress.     Breath sounds: Normal breath sounds.  Musculoskeletal:     Cervical back: Normal range of motion and neck supple.     Right lower leg: No edema.     Left lower leg: No edema.  Skin:    General: Skin is warm and dry.  Neurological:     Mental Status: He is alert. Mental status is at baseline.  Psychiatric:        Mood and Affect: Mood normal.        Behavior: Behavior normal.     ED Results / Procedures / Treatments   Labs (all labs ordered are listed, but only abnormal results are displayed) Labs Reviewed  BASIC METABOLIC PANEL WITH GFR - Abnormal; Notable for the following components:      Result Value   Glucose, Bld 127 (*)    All other components within normal limits  CBC  TROPONIN I (HIGH SENSITIVITY)  TROPONIN I (HIGH SENSITIVITY)    EKG EKG Interpretation Date/Time:  Thursday November 22 2023 18:47:29 EDT Ventricular Rate:  74 PR Interval:  154 QRS Duration:  84 QT Interval:  386 QTC Calculation: 428 R Axis:   -6  Text Interpretation: Normal sinus rhythm Inferior infarct , age undetermined Abnormal ECG When compared with ECG of 12-Sep-2023 00:13, No significant change since last tracing Confirmed by Vonita Moss [phone]) on 11/22/2023 10:20:23 PM  Radiology DG Chest 2 View Result Date: 11/22/2023 CLINICAL DATA:  Sternal chest pain. EXAM: CHEST - 2 VIEW COMPARISON:  September 12, 2023 FINDINGS: The heart size and mediastinal contours are within normal limits. There is mild, stable elevation of the right hemidiaphragm. Mild atelectasis is noted within the right lung base. No pleural effusion or pneumothorax is identified. Multiple chronic  right-sided rib fractures are seen. Multilevel degenerative changes are noted throughout the thoracic spine. IMPRESSION: Mild right basilar atelectasis. Electronically Signed   By: Aram Candela M.D.   On: 11/22/2023 20:11    Procedures Procedures    Medications Ordered in ED Medications  nitroGLYCERIN (NITROSTAT) SL tablet 0.4 mg (0.4 mg Sublingual Given 11/22/23 2049)  aspirin chewable tablet 162 mg (162 mg Oral Given 11/22/23 2047)    ED Course/ Medical Decision Making/ A&P Clinical Course as of 11/22/23 2346  Thu Nov 22, 2023  2303 Dr Debby Bud from cardiology consulted.  Performed shared decision making  with the patient and myself on speaker phone.  We did discuss with the patient that he may need a repeat catheterization to be done.  It was felt that since he is currently symptom-free this could be done as an outpatient but did offer him admission as well feels like it.  He says that since he is feeling well he would like to go home and follow-up with cardiology as an outpatient. [RP]    Clinical Course User Index [RP] Rondel Baton, MD                                 Medical Decision Making Amount and/or Complexity of Data Reviewed Labs: ordered. Radiology: ordered.  Risk OTC drugs. Prescription drug management.   Eric [name] is a 53 y.o. male with comorbidities that complicate the patient evaluation including diabetes, hyperlipidemia, prior tobacco use, and STEMI status post PCI in December 2024 who presents to the emergency department with chest pain.    Initial Ddx:  MI, PE, pneumonia, dissection, pericarditis, costochondritis, reflux  MDM:  With the patient's chest discomfort and recent stent placement I am concerned about MI so well will obtain EKG and troponins.  Will give him aspirin and nitroglycerin at this time as well.  Also considering pulmonary embolism but pain is not pleuritic and he is not having significant shortness of breath or cough or  hypoxia so feel this is less likely.  Considered dissection but with their symmetric pulses, history, and description of the pain feel it is less likely.  If chest x-ray reveals widened mediastinum or any other concerning findings will consider CTA.  Also considered pericarditis but description is unlikely and they do not have risk factors for this diagnosis.  Chest pain not reproducible so feel it costochondritis less likely.  No infectious symptoms to suggest pneumonia at this time that would be causing pleuritic chest pain.  Plan:  Labs Troponin EKG Chest x-ray Aspirin and nitroglycerin  ED Summary/Re-evaluation:  Upon reevaluation patient's pain had improved significantly with aspirin and nitroglycerin.  By the end of his visit his chest pain resolved.  Chest x-ray unremarkable.  EKG similar to prior.  Troponins were WNL and flat.  Given the fact that he did recently have PCI I did discuss this with the on-call cardiologist.  Was felt that he could be having angina but that he does not need to be taken emergently to the Cath Lab.  Performed shared decision making with the patient because he may need a future heart catheterization but he preferred to go home at this time since he is asymptomatic.  Strict return precautions discussed with the patient.  Referral placed to cardiology.  This patient presents to the ED for concern of complaints listed in HPI, this involves an extensive number of treatment options, and is a complaint that carries with it a high risk of complications and morbidity. Disposition including potential need for admission considered.   Dispo: DC Home. Return precautions discussed including, but not limited to, those listed in the AVS. Allowed pt time to ask questions which were answered fully prior to dc.  Additional history obtained from spouse Records reviewed DC Summary The following labs were independently interpreted: Serial Troponins and show no acute abnormality I  independently reviewed the following imaging with scope of interpretation limited to determining acute life threatening conditions related to emergency care: Chest x-ray and agree with the radiologist  interpretation with the following exceptions: none I personally reviewed and interpreted cardiac monitoring: normal sinus rhythm  I personally reviewed and interpreted the pt's EKG: see above for interpretation  I have reviewed the patients home medications and made adjustments as needed Consults: Cardiology   Final Clinical Impression(s) / ED Diagnoses Final diagnoses:  Chest pain, unspecified type    Rx / DC Orders ED Discharge Orders          Ordered    Ambulatory referral to Cardiology        11/22/23 2313              Rondel Baton, MD 11/22/23 [phone]

## 2023-11-22 NOTE — ED Notes (Signed)
 Lab results are back, Md in to see patient, said that his chest pain is down to a 2 on the pain scale, wants to go home. Md will run his case by cardiology.

## 2023-11-22 NOTE — ED Notes (Addendum)
 Pt took one nitroglycerin 0.4 mg that was order by MD Eloise Harman and endorsed his chest pain decreased from 7/10 to 5/10 and pt asked at this time to not take anymore nitroglycerin he would like to wait.  Pt was educated on why we are giving it to him and the importance of taking it.

## 2023-11-22 NOTE — Discharge Instructions (Signed)
 You were seen for your chest pain in the emergency department.   At home, please continue to take your aspirin and Brilinta.  Please take nitroglycerin for any chest pain.    Follow-up with your primary doctor in 2-3 days regarding your visit.  Cardiology will be calling you regarding an appointment within the next 72 hours.  You may contact them if you do not hear from them in that time using the information in this packet.  Return immediately to the emergency department if you experience any of the following: Worsening pain, difficulty breathing, unexplained vomiting or sweating, or any other concerning symptoms.    Thank you for visiting our Emergency Department. It was a pleasure taking care of you today.

## 2023-11-22 NOTE — ED Notes (Addendum)
 Pt endorses that he took one nitroglycerin at 1730 0.4 mg and took metoprolol of 100mg  at the same time.  Pt endorses this is not his normal time to take the metoprolol but due to his head hurting he felt it would help. Pt endorses taking his regular amount of metoprolol 100mg  this morning.  Pt endorses taking his normal amount of Baby ASA of 80mg  this morning with his am meds like normal.

## 2023-11-22 NOTE — ED Triage Notes (Signed)
 Pt arrived via POV c/o sternal chest pain that radiates to his right arm that began yesterday. Pt reports taking all his prescribed medications w/o relief. Pt endorses SOB.

## 2023-11-23 ENCOUNTER — Other Ambulatory Visit: Payer: Self-pay

## 2023-12-04 ENCOUNTER — Ambulatory Visit (HOSPITAL_COMMUNITY)
Admission: RE | Admit: 2023-12-04 | Discharge: 2023-12-04 | Disposition: A | Discharge status: Home / Self Care | Source: Ambulatory Visit | Attending: Cardiology | Admitting: Cardiology

## 2023-12-04 DIAGNOSIS — I5020 Unspecified systolic (congestive) heart failure: Secondary | ICD-10-CM | POA: Insufficient documentation

## 2023-12-04 LAB — ECHOCARDIOGRAM COMPLETE
AR max vel: 2.81 cm2
AV Area VTI: 3.1 cm2
AV Area mean vel: 2.75 cm2
AV Mean grad: 3 mmHg
AV Peak grad: 5.4 mmHg
Ao pk vel: 1.16 m/s
Area-P 1/2: 3.1 cm2
S' Lateral: 3.68 cm
Single Plane A4C EF: 68.8 %

## 2023-12-04 MED ORDER — PERFLUTREN LIPID MICROSPHERE
1.0000 mL | INTRAVENOUS | Status: AC | PRN
  Administered 2023-12-04: 4 mL via INTRAVENOUS

## 2023-12-07 ENCOUNTER — Encounter: Payer: Self-pay | Admitting: Nurse Practitioner

## 2023-12-07 ENCOUNTER — Ambulatory Visit: Attending: Nurse Practitioner | Admitting: Nurse Practitioner

## 2023-12-07 VITALS — BP 124/80 | HR 67 | Ht 72.0 in | Wt 209.4 lb

## 2023-12-07 DIAGNOSIS — E118 Type 2 diabetes mellitus with unspecified complications: Secondary | ICD-10-CM

## 2023-12-07 DIAGNOSIS — I2510 Atherosclerotic heart disease of native coronary artery without angina pectoris: Secondary | ICD-10-CM

## 2023-12-07 DIAGNOSIS — I255 Ischemic cardiomyopathy: Secondary | ICD-10-CM

## 2023-12-07 DIAGNOSIS — E785 Hyperlipidemia, unspecified: Secondary | ICD-10-CM | POA: Diagnosis not present

## 2023-12-07 DIAGNOSIS — I10 Essential (primary) hypertension: Secondary | ICD-10-CM | POA: Diagnosis not present

## 2023-12-07 DIAGNOSIS — Z720 Tobacco use: Secondary | ICD-10-CM

## 2023-12-07 MED ORDER — SPIRONOLACTONE 25 MG PO TABS
25.0000 mg | ORAL_TABLET | Freq: Every day | ORAL | 3 refills | Status: DC
Start: 2023-12-07 — End: 2024-06-06
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
## 2023-12-07 NOTE — Patient Instructions (Addendum)
 Medication Instructions:  Spironolactone 25 mg daily   *If you need a refill on your cardiac medications before your next appointment, please call your pharmacy*  Lab Work: NONE ordered at this time of appointment   Testing/Procedures: NONE ordered at this time of appointment    Follow-Up: At [facility], you and your health needs are our priority.  As part of our continuing mission to provide you with exceptional heart care, our providers are all part of one team.  This team includes your primary Cardiologist (physician) and Advanced Practice Providers or APPs (Physician Assistants and Nurse Practitioners) who all work together to provide you with the care you need, when you need it.  Your next appointment:   6 month(s)  Provider:   Peter Swaziland, MD     We recommend signing up for the patient portal called "MyChart".  Sign up information is provided on this After Visit Summary.  MyChart is used to connect with patients for Virtual Visits (Telemedicine).  Patients are able to view lab/test results, encounter notes, upcoming appointments, etc.  Non-urgent messages can be sent to your provider as well.   To learn more about what you can do with MyChart, go to [email or web address].   Other Instructions       1st Floor: - Lobby - Registration  - Pharmacy  - Lab - Cafe  2nd Floor: - PV Lab - Diagnostic Testing (echo, CT, nuclear med)  3rd Floor: - Vacant  4th Floor: - TCTS (cardiothoracic surgery) - AFib Clinic - Structural Heart Clinic - Vascular Surgery  - Vascular Ultrasound  5th Floor: - [facility] Cardiology (general and EP) - Clinical Pharmacy for coumadin, hypertension, lipid, weight-loss medications, and med management appointments    Valet parking services will be available as well.

## 2023-12-09 ENCOUNTER — Encounter: Payer: Self-pay | Admitting: Nurse Practitioner

## 2024-01-07 ENCOUNTER — Telehealth: Payer: Self-pay | Admitting: Gastroenterology

## 2024-01-07 NOTE — Telephone Encounter (Signed)
 Please let patient know that I discussed timing of colonoscopy with Dr. Riley Cheadle. He says we should wait six months post stenting.   He can be scheduled for colonoscopy anytime starting in June. H/O positive cologuard.   DO NOT HOLD BRILINTA  Hold Jardiance  72 hours AM of TCS: hold glipizide

## 2024-01-07 NOTE — Telephone Encounter (Signed)
 Lmom for pt to return call

## 2024-01-08 ENCOUNTER — Other Ambulatory Visit: Payer: Self-pay | Admitting: *Deleted

## 2024-01-08 MED ORDER — PEG 3350-KCL-NA BICARB-NACL 420 G PO SOLR: 4000.0000 mL | Freq: Once | ORAL | 0 refills | Status: AC

## 2024-01-08 NOTE — Telephone Encounter (Signed)
 Pt was made aware and verbalized understanding and is ready to move forward with scheduling.

## 2024-01-08 NOTE — Telephone Encounter (Signed)
 What asa is this pt?

## 2024-01-08 NOTE — Telephone Encounter (Signed)
 Pt has been scheduled for 02/25/24. Instructions mailed and prep sent

## 2024-01-08 NOTE — Telephone Encounter (Signed)
ASA 3 

## 2024-01-09 ENCOUNTER — Encounter: Payer: Self-pay | Admitting: *Deleted

## 2024-02-05 MED ORDER — NITROGLYCERIN 0.4 MG SL SUBL: 0.4000 mg | SUBLINGUAL_TABLET | SUBLINGUAL | 4 refills | Status: AC | PRN

## 2024-02-19 ENCOUNTER — Encounter (HOSPITAL_COMMUNITY): Payer: Self-pay | Admitting: Anesthesiology

## 2024-02-19 NOTE — Progress Notes (Signed)
 Dr. Kendell reviewed cardiac history, OK to proceed with procedure. No orders given.

## 2024-02-20 ENCOUNTER — Encounter (HOSPITAL_COMMUNITY)
Admission: RE | Admit: 2024-02-20 | Discharge: 2024-02-20 | Disposition: A | Discharge status: Home / Self Care | Source: Ambulatory Visit | Attending: Internal Medicine | Admitting: Internal Medicine

## 2024-02-20 ENCOUNTER — Telehealth: Payer: Self-pay | Admitting: *Deleted

## 2024-02-20 NOTE — Telephone Encounter (Signed)
 Pt says he was confused about doing the pre-op visit. He says he felt like he was being told different things, so he said forget it. He wants to be scheduled after 03/21/24. Advised pt that would take him into August. He says that was fine. Will call pt back to reschedule him once we get providers schedule.  TCS w/Dr.Rourk, asa 3

## 2024-02-20 NOTE — Telephone Encounter (Signed)
 Cancellation Received: Today Levora Randine SQUIBB, RN  Gaylene Madelin CROME, LPN; Jeanell Graeme RAMAN, CMA; Neysa Coy H Patient called us  this morning to cancel his procedure but did not give a reason.  He will be calling the office today.

## 2024-02-23 ENCOUNTER — Emergency Department
Admission: EM | Admit: 2024-02-23 | Discharge: 2024-02-23 | Disposition: A | Discharge status: Home / Self Care | Attending: Emergency Medicine | Admitting: Emergency Medicine

## 2024-02-23 ENCOUNTER — Emergency Department

## 2024-02-23 DIAGNOSIS — R0789 Other chest pain: Secondary | ICD-10-CM | POA: Diagnosis present

## 2024-02-23 DIAGNOSIS — D72829 Elevated white blood cell count, unspecified: Secondary | ICD-10-CM | POA: Insufficient documentation

## 2024-02-23 LAB — CBC
HCT: 42.3 % (ref 39.0–52.0)
Hemoglobin: 14.4 g/dL (ref 13.0–17.0)
MCH: 30.6 pg (ref 26.0–34.0)
MCHC: 34 g/dL (ref 30.0–36.0)
MCV: 90 fL (ref 80.0–100.0)
Platelets: 298 10*3/uL (ref 150–400)
RBC: 4.7 MIL/uL (ref 4.22–5.81)
RDW: 12.3 % (ref 11.5–15.5)
WBC: 10.7 10*3/uL — ABNORMAL HIGH (ref 4.0–10.5)
nRBC: 0 % (ref 0.0–0.2)

## 2024-02-23 LAB — BASIC METABOLIC PANEL WITH GFR
Anion gap: 12 (ref 5–15)
BUN: 14 mg/dL (ref 6–20)
CO2: 22 mmol/L (ref 22–32)
Calcium: 10.7 mg/dL — ABNORMAL HIGH (ref 8.9–10.3)
Chloride: 104 mmol/L (ref 98–111)
Creatinine, Ser: 0.87 mg/dL (ref 0.61–1.24)
GFR, Estimated: >60 mL/min (ref >60)
Glucose, Bld: 179 mg/dL — ABNORMAL HIGH (ref 70–99)
Potassium: 4.3 mmol/L (ref 3.5–5.1)
Sodium: 138 mmol/L (ref 135–145)

## 2024-02-23 LAB — TROPONIN I (HIGH SENSITIVITY)
Troponin I (High Sensitivity): 8 ng/L (ref <18)
Troponin I (High Sensitivity): 7 ng/L (ref <18)

## 2024-02-23 LAB — PROTIME-INR
INR: 1.1 (ref 0.8–1.2)
Prothrombin Time: 14.3 s (ref 11.4–15.2)

## 2024-02-23 MED ORDER — ALUM & MAG HYDROXIDE-SIMETH 200-200-20 MG/5ML PO SUSP
30.0000 mL | Freq: Once | ORAL | Status: AC
  Administered 2024-02-23: 30 mL via ORAL
  Filled 2024-02-23: qty 30

## 2024-02-23 MED ORDER — LIDOCAINE VISCOUS HCL 2 % MT SOLN
15.0000 mL | Freq: Once | OROMUCOSAL | Status: AC
  Administered 2024-02-23: 15 mL via ORAL
  Filled 2024-02-23: qty 15

## 2024-02-23 NOTE — ED Provider Notes (Signed)
 [facility] Provider Note    Event Date/Time   First MD Initiated Contact with Patient 02/23/24 1604     (approximate)   History   Chest Pain   HPI  Eric [name] is a 53 y.o. male who presents to the emergency department today because of concerns for chest pain.  He states that the pain started yesterday.  Located in the left side of his chest.  He describes it as a tightness.  He denies any radiation to his neck or arm.  It was however associated with shaking and tremors of his arms and legs.  He says that he has had this pain before.    Physical Exam   Triage Vital Signs: ED Triage Vitals  Encounter Vitals Group     BP 02/23/24 1609 122/75     Girls Systolic BP Percentile --      Girls Diastolic BP Percentile --      Boys Systolic BP Percentile --      Boys Diastolic BP Percentile --      Pulse Rate 02/23/24 1609 83     Resp 02/23/24 1609 (!) 24     Temp 02/23/24 1609 98 F (36.7 C)     Temp Source 02/23/24 1609 Oral     SpO2 02/23/24 1609 100 %     Weight 02/23/24 1605 188 lb (85.3 kg)     Height 02/23/24 1605 6' (1.829 m)     Head Circumference --      Peak Flow --      Pain Score 02/23/24 1605 7     Pain Loc --      Pain Education --      Exclude from Growth Chart --     Most recent vital signs: Vitals:   02/23/24 1609 02/23/24 1630  BP: 122/75 111/66  Pulse: 83 73  Resp: (!) 24 (!) 28  Temp: 98 F (36.7 C)   SpO2: 100% 98%   General: Awake, alert, oriented. CV:  Good peripheral perfusion. Regular rate and rhythm. Resp:  Normal effort. Lungs clear. Abd:  No distention.    ED Results / Procedures / Treatments   Labs (all labs ordered are listed, but only abnormal results are displayed) Labs Reviewed  BASIC METABOLIC PANEL WITH GFR - Abnormal; Notable for the following components:      Result Value   Glucose, Bld 179 (*)    Calcium  10.7 (*)    All other components within normal limits  CBC - Abnormal; Notable for  the following components:   WBC 10.7 (*)    All other components within normal limits  PROTIME-INR  TROPONIN I (HIGH SENSITIVITY)  TROPONIN I (HIGH SENSITIVITY)     EKG  I, Guadalupe Eagles, attending physician, personally viewed and interpreted this EKG  EKG Time: 1606 Rate: 81 Rhythm: sinus rhythm Axis: left axis deviation Intervals: qtc 424 QRS: narrow ST changes: no st elevation Impression: abnormal ekg  RADIOLOGY I independently interpreted and visualized the CXR. My interpretation: No pneumonia Radiology interpretation:  IMPRESSION:  No acute cardiopulmonary abnormality.    PROCEDURES:  Critical Care performed: No    MEDICATIONS ORDERED IN ED: Medications - No data to display   IMPRESSION / MDM / ASSESSMENT AND PLAN / ED COURSE  I reviewed the triage vital signs and the nursing notes.  Differential diagnosis includes, but is not limited to, ACS, pneumonia, pneumothorax, anxiety, esophagitis  Patient's presentation is most consistent with acute presentation with potential threat to life or bodily function.   The patient is on the cardiac monitor to evaluate for evidence of arrhythmia and/or significant heart rate changes.  Patient presented to the emergency department today because concerns for left sided chest discomfort.  On exam patient is not hypoxic.  Patient is not tachycardic.  No lower extremity edema.  Patient did feel better after GI cocktail.  Troponin was negative x 2.  Chest x-ray without concerning abnormality.  At this time I have very low concern for ACS.  Additionally doubt PE or dissection given clinical history.  Do think it is reasonable for patient be discharged at this time.      FINAL CLINICAL IMPRESSION(S) / ED DIAGNOSES   Final diagnoses:  Atypical chest pain     Note:  This document was prepared using Dragon voice recognition software and may include unintentional dictation errors.    Floy Roberts, MD 02/23/24 2010
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
## 2024-02-23 NOTE — ED Notes (Signed)
 ED Provider at bedside.

## 2024-02-23 NOTE — ED Notes (Signed)
 Patient to radiology for diagnostic studies.

## 2024-02-25 ENCOUNTER — Ambulatory Visit (HOSPITAL_COMMUNITY): Admission: RE | Admit: 2024-02-25 | Source: Home / Self Care | Admitting: Internal Medicine

## 2024-02-25 ENCOUNTER — Encounter (HOSPITAL_COMMUNITY): Admission: RE | Payer: Self-pay | Source: Home / Self Care

## 2024-02-25 SURGERY — COLONOSCOPY
Anesthesia: Choice

## 2024-05-21 ENCOUNTER — Other Ambulatory Visit: Payer: Self-pay | Admitting: Emergency Medicine

## 2024-05-27 ENCOUNTER — Encounter: Payer: Self-pay | Admitting: Dermatology

## 2024-05-27 ENCOUNTER — Ambulatory Visit (INDEPENDENT_AMBULATORY_CARE_PROVIDER_SITE_OTHER): Admitting: Dermatology

## 2024-05-27 DIAGNOSIS — B360 Pityriasis versicolor: Secondary | ICD-10-CM | POA: Diagnosis not present

## 2024-05-27 MED ORDER — KETOCONAZOLE 2 % EX SHAM: MEDICATED_SHAMPOO | CUTANEOUS | 11 refills | Status: AC

## 2024-05-27 NOTE — Patient Instructions (Signed)

## 2024-05-27 NOTE — Progress Notes (Signed)
   New Patient Visit   Subjective  Eric [name] is a 53 y.o. male who presents for the following: rash at trunk, mild at arms and groin. Itches. Patient advises he had similar rash when he was younger and was given a pill that took care of it.     The following portions of the chart were reviewed this encounter and updated as appropriate: medications, allergies, medical history  Review of Systems:  No other skin or systemic complaints except as noted in HPI or Assessment and Plan.  Objective  Well appearing patient in no apparent distress; mood and affect are within normal limits.   A focused examination was performed of the following areas: Trunk, arms  Relevant exam findings are noted in the Assessment and Plan.    Assessment & Plan   Tinea Versicolor  Exam: tan macules coalescing to patches on trunk and shoulders  Chronic and persistent condition with duration or expected duration over one year. Condition is bothersome/symptomatic for patient. Currently flared.   Tinea versicolor is a chronic recurrent skin rash causing discolored scaly spots most commonly seen on back, chest, and/or shoulders.  It is generally asymptomatic. The rash is due to overgrowth of a common type of yeast present on everyone's skin and it is not contagious.  It tends to flare more in the summer due to increased sweating on trunk.  After rash is treated, the scaliness will resolve, but the discoloration will take longer to return to normal pigmentation. The periodic use of an OTC medicated soap/shampoo with zinc or selenium sulfide can be helpful to prevent yeast overgrowth and recurrence.  Plan: Use ketoconazole shampoo as body wash daily in shower until clear and then prn especially in summer months  Patient advised fluconazole has interactions with current meds so will start with shampoo. Patient will let us  know in a month if not improving and can consider adding oral.   TINEA  VERSICOLOR    Return if symptoms worsen or fail to improve.  Eric [name], RMA, am acting as scribe for Boneta Sharps, MD .   Documentation: I have reviewed the above documentation for accuracy and completeness, and I agree with the above.  Boneta Sharps, MD
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
## 2024-06-06 ENCOUNTER — Ambulatory Visit: Attending: Cardiology | Admitting: Cardiology

## 2024-06-06 VITALS — BP 100/68 | HR 78 | Ht 72.0 in | Wt 199.0 lb

## 2024-06-06 DIAGNOSIS — E118 Type 2 diabetes mellitus with unspecified complications: Secondary | ICD-10-CM | POA: Diagnosis present

## 2024-06-06 DIAGNOSIS — Z720 Tobacco use: Secondary | ICD-10-CM | POA: Diagnosis present

## 2024-06-06 DIAGNOSIS — I2510 Atherosclerotic heart disease of native coronary artery without angina pectoris: Secondary | ICD-10-CM | POA: Insufficient documentation

## 2024-06-06 DIAGNOSIS — I10 Essential (primary) hypertension: Secondary | ICD-10-CM | POA: Insufficient documentation

## 2024-06-06 DIAGNOSIS — E785 Hyperlipidemia, unspecified: Secondary | ICD-10-CM | POA: Diagnosis present

## 2024-06-06 MED ORDER — ATORVASTATIN CALCIUM 80 MG PO TABS: 80.0000 mg | ORAL_TABLET | Freq: Every day | ORAL | 1 refills | Status: AC

## 2024-06-06 MED ORDER — METOPROLOL SUCCINATE ER 50 MG PO TB24: 50.0000 mg | ORAL_TABLET | Freq: Every day | ORAL | 3 refills | Status: AC

## 2024-06-06 MED ORDER — LOSARTAN POTASSIUM 100 MG PO TABS
100.0000 mg | ORAL_TABLET | Freq: Every day | ORAL | 3 refills | Status: AC
Start: 2024-06-06 — End: ?

## 2024-06-06 MED ORDER — SPIRONOLACTONE 25 MG PO TABS: 25.0000 mg | ORAL_TABLET | Freq: Every day | ORAL | 3 refills | Status: AC

## 2024-06-06 NOTE — Patient Instructions (Signed)
 Medication Instructions:  Decrease Toprol  to 50 mg daily 07/30/24 Stop Brilinta   Continue all other medications *If you need a refill on your cardiac medications before your next appointment, please call your pharmacy*  Lab Work: None ordered  Testing/Procedures: None ordered  Follow-Up: At [facility], you and your health needs are our priority.  As part of our continuing mission to provide you with exceptional heart care, our providers are all part of one team.  This team includes your primary Cardiologist (physician) and Advanced Practice Providers or APPs (Physician Assistants and Nurse Practitioners) who all work together to provide you with the care you need, when you need it.  Your next appointment:  6 months    Call in Jan to schedule April appointment    Provider:  Dr.Jordan   We recommend signing up for the patient portal called MyChart.  Sign up information is provided on this After Visit Summary.  MyChart is used to connect with patients for Virtual Visits (Telemedicine).  Patients are able to view lab/test results, encounter notes, upcoming appointments, etc.  Non-urgent messages can be sent to your provider as well.   To learn more about what you can do with MyChart, go to [email or web address].

## 2024-06-24 LAB — LAB REPORT - SCANNED
A1c: 5.9
Albumin, Urine POC: 13.1
Albumin/Creatinine Ratio, Urine, POC: 7
Creatinine, POC: 198.7 mg/dL
EGFR: 107

## 2024-07-22 ENCOUNTER — Other Ambulatory Visit: Payer: Self-pay | Admitting: Emergency Medicine

## 2024-07-29 ENCOUNTER — Other Ambulatory Visit: Payer: Self-pay | Admitting: Emergency Medicine

## 2024-08-27 ENCOUNTER — Telehealth: Payer: Self-pay | Admitting: Emergency Medicine

## 2024-08-27 NOTE — Telephone Encounter (Signed)
" °*  STAT* If patient is at the pharmacy, call can be transferred to refill team.   1. Which medications need to be refilled? (please list name of each medication and dose if known)   empagliflozin  (JARDIANCE ) 10 MG TABS tablet   2. Would you like to learn more about the convenience, safety, & potential cost savings by using the [facility]?   3. Are you open to using the [facility] (Type [facility]. ).  4. Which pharmacy/location (including street and city if local pharmacy) is medication to be sent to?  [facility] - [place], [place] - [address]   5. Do they need a 30 day or 90 day supply?   90 day  Patient stated he has 3-4 tablets left.  "

## 2024-08-29 ENCOUNTER — Telehealth: Payer: Self-pay | Admitting: Pharmacy Technician

## 2024-08-29 NOTE — Telephone Encounter (Signed)
" ° ° °  Pharmacy Patient Advocate Encounter   Received notification from Onbase that prior authorization for JARDIANCE  is required/requested.   Insurance verification completed.   The patient is insured through [facility] MEDICAID.   Per test claim: PA required; PA submitted to above mentioned insurance via Latent Key/confirmation #/EOC [id number] Status is pending  "

## 2024-08-29 NOTE — Telephone Encounter (Signed)
 Pharmacy Patient Advocate Encounter  Received notification from [facility] MEDICAID that Prior Authorization for jardiance  has been APPROVED from 08/29/24 to 08/29/25   PA #/Case ID/Reference #: [id number]

## 2024-09-01 NOTE — Telephone Encounter (Signed)
 Pt's medication was sent to pt's pharmacy as requested. Confirmation received.

## 2024-10-20 ENCOUNTER — Ambulatory Visit: Admitting: Urology
